# Patient Record
Sex: Male | Born: 1960 | Race: Black or African American | Hispanic: No | Marital: Married | Smoking: Former smoker
Health system: Southern US, Community
[De-identification: ages and names within clinical notes are randomized; demographics above are authoritative.]

## PROBLEM LIST (undated history)

## (undated) DIAGNOSIS — F1020 Alcohol dependence, uncomplicated: Secondary | ICD-10-CM

## (undated) DIAGNOSIS — I10 Essential (primary) hypertension: Secondary | ICD-10-CM

---

## 2004-05-16 ENCOUNTER — Emergency Department: Payer: Self-pay | Admitting: Emergency Medicine

## 2004-06-08 ENCOUNTER — Emergency Department: Payer: Self-pay | Admitting: Emergency Medicine

## 2006-10-22 ENCOUNTER — Emergency Department: Payer: Self-pay | Admitting: Emergency Medicine

## 2009-05-20 ENCOUNTER — Emergency Department (HOSPITAL_COMMUNITY): Admission: EM | Admit: 2009-05-20 | Discharge: 2009-05-20 | Payer: Self-pay | Admitting: Emergency Medicine

## 2010-06-26 LAB — COMPREHENSIVE METABOLIC PANEL
ALT: 46 U/L (ref 0–53)
Alkaline Phosphatase: 58 U/L (ref 39–117)
BUN: 8 mg/dL (ref 6–23)
CO2: 25 mEq/L (ref 19–32)
Chloride: 100 mEq/L (ref 96–112)
GFR calc non Af Amer: >60 mL/min (ref >60)
Glucose, Bld: 103 mg/dL — ABNORMAL HIGH (ref 70–99)
Potassium: 3.8 mEq/L (ref 3.5–5.1)
Sodium: 134 mEq/L — ABNORMAL LOW (ref 135–145)
Total Bilirubin: 0.4 mg/dL (ref 0.3–1.2)
Total Protein: 7.3 g/dL (ref 6.0–8.3)

## 2010-06-26 LAB — DIFFERENTIAL
Basophils Absolute: 0 10*3/uL (ref 0.0–0.1)
Basophils Relative: 1 % (ref 0–1)
Eosinophils Absolute: 0.1 10*3/uL (ref 0.0–0.7)
Monocytes Relative: 15 % — ABNORMAL HIGH (ref 3–12)
Neutro Abs: 2.6 10*3/uL (ref 1.7–7.7)
Neutrophils Relative %: 65 % (ref 43–77)

## 2010-06-26 LAB — CBC
HCT: 36.7 % — ABNORMAL LOW (ref 39.0–52.0)
Hemoglobin: 12.5 g/dL — ABNORMAL LOW (ref 13.0–17.0)
RBC: 3.76 MIL/uL — ABNORMAL LOW (ref 4.22–5.81)

## 2010-06-26 LAB — ETHANOL: Alcohol, Ethyl (B): <5 mg/dL (ref 0–10)

## 2010-06-26 LAB — RAPID URINE DRUG SCREEN, HOSP PERFORMED
Opiates: NOT DETECTED
Tetrahydrocannabinol: POSITIVE — AB

## 2015-12-11 ENCOUNTER — Encounter (HOSPITAL_COMMUNITY): Payer: Self-pay

## 2015-12-11 ENCOUNTER — Emergency Department (HOSPITAL_COMMUNITY)
Admission: EM | Admit: 2015-12-11 | Discharge: 2015-12-11 | Disposition: A | Discharge status: Home / Self Care | Payer: Self-pay | Attending: Emergency Medicine | Admitting: Emergency Medicine

## 2015-12-11 DIAGNOSIS — F17200 Nicotine dependence, unspecified, uncomplicated: Secondary | ICD-10-CM | POA: Insufficient documentation

## 2015-12-11 DIAGNOSIS — Z5181 Encounter for therapeutic drug level monitoring: Secondary | ICD-10-CM | POA: Insufficient documentation

## 2015-12-11 DIAGNOSIS — F10120 Alcohol abuse with intoxication, uncomplicated: Secondary | ICD-10-CM | POA: Insufficient documentation

## 2015-12-11 DIAGNOSIS — F10920 Alcohol use, unspecified with intoxication, uncomplicated: Secondary | ICD-10-CM

## 2015-12-11 LAB — I-STAT CHEM 8, ED
BUN: 3 mg/dL — AB (ref 6–20)
CALCIUM ION: 1.12 mmol/L — AB (ref 1.15–1.40)
CHLORIDE: 100 mmol/L — AB (ref 101–111)
Creatinine, Ser: 1.1 mg/dL (ref 0.61–1.24)
Glucose, Bld: 95 mg/dL (ref 65–99)
HEMATOCRIT: 38 % — AB (ref 39.0–52.0)
Hemoglobin: 12.9 g/dL — ABNORMAL LOW (ref 13.0–17.0)
Potassium: 3.5 mmol/L (ref 3.5–5.1)
SODIUM: 143 mmol/L (ref 135–145)
TCO2: 27 mmol/L (ref 0–100)

## 2015-12-11 LAB — RAPID URINE DRUG SCREEN, HOSP PERFORMED
AMPHETAMINES: NOT DETECTED
BARBITURATES: NOT DETECTED
BENZODIAZEPINES: NOT DETECTED
Cocaine: NOT DETECTED
Opiates: NOT DETECTED
Tetrahydrocannabinol: POSITIVE — AB

## 2015-12-11 LAB — ETHANOL: ALCOHOL ETHYL (B): 291 mg/dL — AB (ref <5)

## 2015-12-11 MED ORDER — NALOXONE HCL 0.4 MG/ML IJ SOLN: 0.4000 mg | Freq: Once | INTRAMUSCULAR | Status: DC

## 2015-12-11 MED ORDER — NALOXONE HCL 2 MG/2ML IJ SOSY
PREFILLED_SYRINGE | INTRAMUSCULAR | Status: AC
  Administered 2015-12-11: 2 mg
  Filled 2015-12-11: qty 2

## 2015-12-11 NOTE — ED Triage Notes (Addendum)
Per [facility], Pt was [place] in park unresponsive with apnea. Pt was given 4 mg of Narcan before he was at adequate respirations. When pt arrived to the ED, pt was alert and responsive to the staff. Pt admits to ETOH, but denies drug use. Vitals per EMS: 176/102, 120 HR, 14 RR, 97% on RA. Placed 18 Gauge in the L AC.

## 2015-12-11 NOTE — ED Notes (Signed)
Pt left at this time with all belongings.  

## 2015-12-11 NOTE — ED Provider Notes (Signed)
[facility]ENCY DEPT Provider Note   CSN: [id number] Arrival date & time: 12/11/15  1805     History   Chief Complaint Chief Complaint  Patient presents with  . Drug Overdose    HPI Scott Choi is a 55 y.o. male.  HPI Patient was found unresponsive by EMS at a park.  Reported the patient been drinking.  He responded to a total of 4 mg of Narcan and team suspected use of heroin.  Patient denies at this time.  EMS reports he is much more alert after Narcan.  Patient admits to smoking cannabis and using alcohol.  He denies use of heroin.     History reviewed. No pertinent past medical history.  There are no active problems to display for this patient.   History reviewed. No pertinent surgical history.     Home Medications    Prior to Admission medications   Medication Sig Start Date End Date Taking? Authorizing Provider  ibuprofen (ADVIL,MOTRIN) 200 MG tablet Take 200 mg by mouth every 6 (six) hours as needed for moderate pain.   Yes Historical Provider, MD    Family History No family history on file.  Social History Social History  Substance Use Topics  . Smoking status: Current Some Day Smoker  . Smokeless tobacco: Never Used  . Alcohol use Yes     Allergies   Aspirin   Review of Systems Review of Systems  All other systems reviewed and are negative.    Physical Exam Updated Vital Signs BP 121/84   Pulse 74   Temp 98.1 F (36.7 C) (Oral)   Resp 14   Ht 5\' 10"  (1.778 m)   Wt 150 lb (68 kg)   SpO2 100%   BMI 21.52 kg/m   Physical Exam  Constitutional: He is oriented to person, place, and time. He appears well-developed and well-nourished.  HENT:  Head: Normocephalic and atraumatic.  Eyes: EOM are normal.  Neck: Normal range of motion.  Cardiovascular: Normal rate, regular rhythm, normal heart sounds and intact distal pulses.   Pulmonary/Chest: Effort normal and breath sounds normal. No respiratory distress.  Abdominal: Soft. He  exhibits no distension. There is no tenderness.  Musculoskeletal: Normal range of motion.  Neurological: He is alert and oriented to person, place, and time.  Skin: Skin is warm and dry.  Psychiatric: He has a normal mood and affect. Judgment normal.  Nursing note and vitals reviewed.    ED Treatments / Results  Labs (all labs ordered are listed, but only abnormal results are displayed) Labs Reviewed  ETHANOL - Abnormal; Notable for the following:       Result Value   Alcohol, Ethyl (B) 291 (*)    All other components within normal limits  URINE RAPID DRUG SCREEN, HOSP PERFORMED - Abnormal; Notable for the following:    Tetrahydrocannabinol POSITIVE (*)    All other components within normal limits  I-STAT CHEM 8, ED - Abnormal; Notable for the following:    Chloride 100 (*)    BUN 3 (*)    Calcium, Ion 1.12 (*)    Hemoglobin 12.9 (*)    HCT 38.0 (*)    All other components within normal limits    EKG  EKG Interpretation None       Radiology No results found.  Procedures Procedures (including critical care time)  Medications Ordered in ED Medications  naloxone [facility](NARCAN) 2 MG/2ML injection (2 mg  Given 12/11/15 1902)     Initial Impression /  Assessment and Plan / ED Course  I have reviewed the triage vital signs and the nursing notes.  Pertinent labs & imaging results that were available during my care of the patient were reviewed by me and considered in my medical decision making (see chart for details).  Clinical Course    10:28 PM Patient feels much better this time.  Immature retina emergency department.  Urine drug screen negative except for marijuana.  Alcohol intoxication here.  Discharge home in good condition.  Final Clinical Impressions(s) / ED Diagnoses   Final diagnoses:  Alcohol intoxication, uncomplicated (HCC)    New Prescriptions New Prescriptions   No medications on file     Azalia Bilis, MD 12/11/15 2231

## 2015-12-11 NOTE — ED Notes (Signed)
Pt sleeping, but easy to rouse. Turned down lights so he could sleep.

## 2015-12-11 NOTE — ED Notes (Signed)
Ambulated patient in hall at request of MD, pt ambulated at a steady gait without assistance. Removed IV and instructed patient to dress. Notified MD.

## 2015-12-12 LAB — CBG MONITORING, ED: GLUCOSE-CAPILLARY: 106 mg/dL — AB (ref 65–99)

## 2019-01-12 ENCOUNTER — Inpatient Hospital Stay (HOSPITAL_COMMUNITY)
Admission: EM | Admit: 2019-01-12 | Discharge: 2019-01-15 | DRG: 812 | Disposition: A | Discharge status: Home / Self Care | Payer: Self-pay | Attending: Internal Medicine | Admitting: Internal Medicine

## 2019-01-12 ENCOUNTER — Emergency Department (HOSPITAL_COMMUNITY): Payer: Self-pay

## 2019-01-12 ENCOUNTER — Encounter (HOSPITAL_COMMUNITY): Payer: Self-pay

## 2019-01-12 ENCOUNTER — Other Ambulatory Visit: Payer: Self-pay

## 2019-01-12 DIAGNOSIS — Z20828 Contact with and (suspected) exposure to other viral communicable diseases: Secondary | ICD-10-CM | POA: Diagnosis present

## 2019-01-12 DIAGNOSIS — T39395A Adverse effect of other nonsteroidal anti-inflammatory drugs [NSAID], initial encounter: Secondary | ICD-10-CM | POA: Diagnosis present

## 2019-01-12 DIAGNOSIS — K209 Esophagitis, unspecified without bleeding: Secondary | ICD-10-CM | POA: Clinically undetermined

## 2019-01-12 DIAGNOSIS — Z791 Long term (current) use of non-steroidal anti-inflammatories (NSAID): Secondary | ICD-10-CM

## 2019-01-12 DIAGNOSIS — K642 Third degree hemorrhoids: Secondary | ICD-10-CM | POA: Diagnosis present

## 2019-01-12 DIAGNOSIS — K2970 Gastritis, unspecified, without bleeding: Secondary | ICD-10-CM | POA: Diagnosis present

## 2019-01-12 DIAGNOSIS — F10239 Alcohol dependence with withdrawal, unspecified: Secondary | ICD-10-CM

## 2019-01-12 DIAGNOSIS — F10939 Alcohol use, unspecified with withdrawal, unspecified: Secondary | ICD-10-CM

## 2019-01-12 DIAGNOSIS — K920 Hematemesis: Secondary | ICD-10-CM | POA: Diagnosis present

## 2019-01-12 DIAGNOSIS — G40909 Epilepsy, unspecified, not intractable, without status epilepticus: Secondary | ICD-10-CM | POA: Diagnosis present

## 2019-01-12 DIAGNOSIS — Z800 Family history of malignant neoplasm of digestive organs: Secondary | ICD-10-CM

## 2019-01-12 DIAGNOSIS — D500 Iron deficiency anemia secondary to blood loss (chronic): Principal | ICD-10-CM | POA: Diagnosis present

## 2019-01-12 DIAGNOSIS — Y900 Blood alcohol level of less than 20 mg/100 ml: Secondary | ICD-10-CM | POA: Diagnosis present

## 2019-01-12 DIAGNOSIS — Z8719 Personal history of other diseases of the digestive system: Secondary | ICD-10-CM

## 2019-01-12 DIAGNOSIS — Z87891 Personal history of nicotine dependence: Secondary | ICD-10-CM

## 2019-01-12 DIAGNOSIS — D508 Other iron deficiency anemias: Secondary | ICD-10-CM

## 2019-01-12 DIAGNOSIS — Z8249 Family history of ischemic heart disease and other diseases of the circulatory system: Secondary | ICD-10-CM

## 2019-01-12 DIAGNOSIS — D649 Anemia, unspecified: Secondary | ICD-10-CM | POA: Diagnosis present

## 2019-01-12 DIAGNOSIS — I10 Essential (primary) hypertension: Secondary | ICD-10-CM | POA: Diagnosis present

## 2019-01-12 DIAGNOSIS — Z833 Family history of diabetes mellitus: Secondary | ICD-10-CM

## 2019-01-12 DIAGNOSIS — Z886 Allergy status to analgesic agent status: Secondary | ICD-10-CM

## 2019-01-12 DIAGNOSIS — K449 Diaphragmatic hernia without obstruction or gangrene: Secondary | ICD-10-CM | POA: Diagnosis present

## 2019-01-12 DIAGNOSIS — K2100 Gastro-esophageal reflux disease with esophagitis, without bleeding: Secondary | ICD-10-CM | POA: Diagnosis present

## 2019-01-12 DIAGNOSIS — Z79899 Other long term (current) drug therapy: Secondary | ICD-10-CM

## 2019-01-12 DIAGNOSIS — F10230 Alcohol dependence with withdrawal, uncomplicated: Secondary | ICD-10-CM | POA: Diagnosis present

## 2019-01-12 DIAGNOSIS — R569 Unspecified convulsions: Secondary | ICD-10-CM

## 2019-01-12 HISTORY — DX: Alcohol dependence, uncomplicated: F10.20

## 2019-01-12 HISTORY — DX: Essential (primary) hypertension: I10

## 2019-01-12 LAB — CBC WITH DIFFERENTIAL/PLATELET
Abs Immature Granulocytes: 0.01 10*3/uL (ref 0.00–0.07)
Basophils Absolute: 0.1 10*3/uL (ref 0.0–0.1)
Basophils Relative: 2 %
Eosinophils Absolute: 0 10*3/uL (ref 0.0–0.5)
Eosinophils Relative: 0 %
HCT: 25 % — ABNORMAL LOW (ref 39.0–52.0)
Hemoglobin: 7 g/dL — ABNORMAL LOW (ref 13.0–17.0)
Immature Granulocytes: 0 %
Lymphocytes Relative: 9 %
Lymphs Abs: 0.3 10*3/uL — ABNORMAL LOW (ref 0.7–4.0)
MCH: 20.8 pg — ABNORMAL LOW (ref 26.0–34.0)
MCHC: 28 g/dL — ABNORMAL LOW (ref 30.0–36.0)
MCV: 74.4 fL — ABNORMAL LOW (ref 80.0–100.0)
Monocytes Absolute: 0.6 10*3/uL (ref 0.1–1.0)
Monocytes Relative: 18 %
Neutro Abs: 2.1 10*3/uL (ref 1.7–7.7)
Neutrophils Relative %: 71 %
Platelets: 243 10*3/uL (ref 150–400)
RBC: 3.36 MIL/uL — ABNORMAL LOW (ref 4.22–5.81)
RDW: 20.8 % — ABNORMAL HIGH (ref 11.5–15.5)
WBC: 3 10*3/uL — ABNORMAL LOW (ref 4.0–10.5)
nRBC: 0 % (ref 0.0–0.2)

## 2019-01-12 LAB — COMPREHENSIVE METABOLIC PANEL
ALT: 44 U/L (ref 0–44)
AST: 125 U/L — ABNORMAL HIGH (ref 15–41)
Albumin: 3.5 g/dL (ref 3.5–5.0)
Alkaline Phosphatase: 125 U/L (ref 38–126)
Anion gap: 19 — ABNORMAL HIGH (ref 5–15)
BUN: <5 mg/dL — ABNORMAL LOW (ref 6–20)
CO2: 17 mmol/L — ABNORMAL LOW (ref 22–32)
Calcium: 9.1 mg/dL (ref 8.9–10.3)
Chloride: 98 mmol/L (ref 98–111)
Creatinine, Ser: 0.9 mg/dL (ref 0.61–1.24)
GFR calc Af Amer: >60 mL/min (ref >60)
GFR calc non Af Amer: >60 mL/min (ref >60)
Glucose, Bld: 98 mg/dL (ref 70–99)
Potassium: 3.5 mmol/L (ref 3.5–5.1)
Sodium: 134 mmol/L — ABNORMAL LOW (ref 135–145)
Total Bilirubin: 0.7 mg/dL (ref 0.3–1.2)
Total Protein: 8.2 g/dL — ABNORMAL HIGH (ref 6.5–8.1)

## 2019-01-12 LAB — POCT I-STAT EG7
Acid-base deficit: 3 mmol/L — ABNORMAL HIGH (ref 0.0–2.0)
Bicarbonate: 22 mmol/L (ref 20.0–28.0)
Calcium, Ion: 1.14 mmol/L — ABNORMAL LOW (ref 1.15–1.40)
HCT: 25 % — ABNORMAL LOW (ref 39.0–52.0)
Hemoglobin: 8.5 g/dL — ABNORMAL LOW (ref 13.0–17.0)
O2 Saturation: 74 %
Potassium: 3.5 mmol/L (ref 3.5–5.1)
Sodium: 135 mmol/L (ref 135–145)
TCO2: 23 mmol/L (ref 22–32)
pCO2, Ven: 36.7 mmHg — ABNORMAL LOW (ref 44.0–60.0)
pH, Ven: 7.386 (ref 7.250–7.430)
pO2, Ven: 39 mmHg (ref 32.0–45.0)

## 2019-01-12 LAB — FERRITIN: Ferritin: 3 ng/mL — ABNORMAL LOW (ref 24–336)

## 2019-01-12 LAB — RAPID URINE DRUG SCREEN, HOSP PERFORMED
Amphetamines: NOT DETECTED
Barbiturates: NOT DETECTED
Benzodiazepines: NOT DETECTED
Cocaine: NOT DETECTED
Opiates: NOT DETECTED
Tetrahydrocannabinol: POSITIVE — AB

## 2019-01-12 LAB — POC OCCULT BLOOD, ED: Fecal Occult Bld: NEGATIVE

## 2019-01-12 LAB — FOLATE: Folate: 15.2 ng/mL (ref >5.9)

## 2019-01-12 LAB — MAGNESIUM: Magnesium: 1.5 mg/dL — ABNORMAL LOW (ref 1.7–2.4)

## 2019-01-12 LAB — IRON AND TIBC
Iron: 25 ug/dL — ABNORMAL LOW (ref 45–182)
Saturation Ratios: 5 % — ABNORMAL LOW (ref 17.9–39.5)
TIBC: 515 ug/dL — ABNORMAL HIGH (ref 250–450)
UIBC: 490 ug/dL

## 2019-01-12 LAB — PREPARE RBC (CROSSMATCH)

## 2019-01-12 LAB — VITAMIN B12: Vitamin B-12: 158 pg/mL — ABNORMAL LOW (ref 180–914)

## 2019-01-12 LAB — RETICULOCYTES
Immature Retic Fract: 24.9 % — ABNORMAL HIGH (ref 2.3–15.9)
RBC.: 3.17 MIL/uL — ABNORMAL LOW (ref 4.22–5.81)
Retic Count, Absolute: 52.3 10*3/uL (ref 19.0–186.0)
Retic Ct Pct: 1.7 % (ref 0.4–3.1)

## 2019-01-12 LAB — SARS CORONAVIRUS 2 BY RT PCR (HOSPITAL ORDER, PERFORMED IN ~~LOC~~ HOSPITAL LAB): SARS Coronavirus 2: NEGATIVE

## 2019-01-12 LAB — HEMOGLOBIN: Hemoglobin: 6.6 g/dL — CL (ref 13.0–17.0)

## 2019-01-12 LAB — ETHANOL: Alcohol, Ethyl (B): <10 mg/dL (ref <10)

## 2019-01-12 LAB — PHOSPHORUS: Phosphorus: 3.6 mg/dL (ref 2.5–4.6)

## 2019-01-12 LAB — HIV ANTIBODY (ROUTINE TESTING W REFLEX): HIV Screen 4th Generation wRfx: NONREACTIVE

## 2019-01-12 LAB — ABO/RH: ABO/RH(D): O POS

## 2019-01-12 MED ORDER — LORAZEPAM 2 MG/ML IJ SOLN
1.0000 mg | Freq: Once | INTRAMUSCULAR | Status: AC
  Administered 2019-01-12: 1 mg via INTRAVENOUS
  Filled 2019-01-12: qty 1

## 2019-01-12 MED ORDER — PANTOPRAZOLE SODIUM 40 MG IV SOLR
40.0000 mg | Freq: Once | INTRAVENOUS | Status: AC
  Administered 2019-01-12: 11:00:00 40 mg via INTRAVENOUS
  Filled 2019-01-12: qty 40

## 2019-01-12 MED ORDER — LORAZEPAM 1 MG PO TABS
1.0000 mg | ORAL_TABLET | ORAL | Status: DC | PRN
  Administered 2019-01-13: 1 mg via ORAL
  Filled 2019-01-12: qty 1

## 2019-01-12 MED ORDER — ADULT MULTIVITAMIN W/MINERALS CH
1.0000 | ORAL_TABLET | Freq: Every day | ORAL | Status: DC
  Administered 2019-01-13 – 2019-01-15 (×2): 1 via ORAL
  Filled 2019-01-12 (×2): qty 1

## 2019-01-12 MED ORDER — SODIUM CHLORIDE 0.9 % IV BOLUS
1000.0000 mL | Freq: Once | INTRAVENOUS | Status: AC
  Administered 2019-01-12: 14:00:00 1000 mL via INTRAVENOUS

## 2019-01-12 MED ORDER — SODIUM CHLORIDE 0.9 % IV SOLN: 250.0000 mL | INTRAVENOUS | Status: DC | PRN

## 2019-01-12 MED ORDER — FOLIC ACID 1 MG PO TABS
1.0000 mg | ORAL_TABLET | Freq: Every day | ORAL | Status: DC
  Administered 2019-01-13 – 2019-01-15 (×2): 1 mg via ORAL
  Filled 2019-01-12 (×2): qty 1

## 2019-01-12 MED ORDER — ONDANSETRON HCL 4 MG/2ML IJ SOLN
4.0000 mg | Freq: Once | INTRAMUSCULAR | Status: AC
  Administered 2019-01-12: 4 mg via INTRAVENOUS
  Filled 2019-01-12: qty 2

## 2019-01-12 MED ORDER — SODIUM CHLORIDE 0.9 % IV BOLUS
1000.0000 mL | Freq: Once | INTRAVENOUS | Status: AC
  Administered 2019-01-12: 11:00:00 1000 mL via INTRAVENOUS

## 2019-01-12 MED ORDER — ACETAMINOPHEN 325 MG PO TABS
650.0000 mg | ORAL_TABLET | Freq: Four times a day (QID) | ORAL | Status: DC | PRN
  Administered 2019-01-14: 650 mg via ORAL
  Filled 2019-01-12: qty 2

## 2019-01-12 MED ORDER — ACETAMINOPHEN 650 MG RE SUPP: 650.0000 mg | Freq: Four times a day (QID) | RECTAL | Status: DC | PRN

## 2019-01-12 MED ORDER — ONDANSETRON HCL 4 MG/2ML IJ SOLN
4.0000 mg | Freq: Four times a day (QID) | INTRAMUSCULAR | Status: DC | PRN
  Administered 2019-01-14: 4 mg via INTRAVENOUS
  Filled 2019-01-12: qty 2

## 2019-01-12 MED ORDER — SODIUM CHLORIDE 0.9% FLUSH
3.0000 mL | Freq: Two times a day (BID) | INTRAVENOUS | Status: DC
  Administered 2019-01-13 (×2): 3 mL via INTRAVENOUS

## 2019-01-12 MED ORDER — VITAMIN B-1 100 MG PO TABS
100.0000 mg | ORAL_TABLET | Freq: Every day | ORAL | Status: DC
  Administered 2019-01-13 – 2019-01-15 (×2): 100 mg via ORAL
  Filled 2019-01-12 (×3): qty 1

## 2019-01-12 MED ORDER — LORAZEPAM 2 MG/ML IJ SOLN
1.0000 mg | INTRAMUSCULAR | Status: DC | PRN
  Administered 2019-01-12: 18:00:00 3 mg via INTRAVENOUS
  Filled 2019-01-12: qty 2

## 2019-01-12 MED ORDER — ONDANSETRON HCL 4 MG PO TABS: 4.0000 mg | ORAL_TABLET | Freq: Four times a day (QID) | ORAL | Status: DC | PRN

## 2019-01-12 MED ORDER — SODIUM CHLORIDE 0.9% FLUSH
3.0000 mL | INTRAVENOUS | Status: DC | PRN
  Administered 2019-01-12: 3 mL via INTRAVENOUS

## 2019-01-12 MED ORDER — SODIUM CHLORIDE 0.9% IV SOLUTION: Freq: Once | INTRAVENOUS | Status: DC

## 2019-01-12 MED ORDER — THIAMINE HCL 100 MG/ML IJ SOLN
Freq: Once | INTRAVENOUS | Status: AC
  Administered 2019-01-12: 20:00:00 via INTRAVENOUS
  Filled 2019-01-12: qty 1000

## 2019-01-12 MED ORDER — PANTOPRAZOLE SODIUM 40 MG IV SOLR
40.0000 mg | Freq: Two times a day (BID) | INTRAVENOUS | Status: DC
  Administered 2019-01-12 – 2019-01-13 (×2): 40 mg via INTRAVENOUS
  Filled 2019-01-12 (×2): qty 40

## 2019-01-12 MED ORDER — THIAMINE HCL 100 MG/ML IJ SOLN: 100.0000 mg | Freq: Every day | INTRAMUSCULAR | Status: DC

## 2019-01-12 NOTE — ED Notes (Signed)
Patient sister Arvid Right [phone]  Would like a call back, states patient won't be able to tell her what is wrong with him.

## 2019-01-12 NOTE — ED Notes (Signed)
ED TO INPATIENT HANDOFF REPORT  ED Nurse Name and Phone #:  [phone]  S Name/Age/Gender Scott Choi 58 y.o. male Room/Bed: 014C/014C  Code Status   Code Status: Full Code  Home/SNF/Other Home Patient oriented to: self and situation Is this baseline? No   Triage Complete: Triage complete  Chief Complaint sz  Triage Note Pt brought via [facility] from a street corner. Pt had a witnessed seizure by a by stander. Full body seizure that lasted 1-2 mins. Pt denies hx of seizures. EMS stated pt was post tictal and only responded to verbal stimuli on scene. Pt states he drinks a couple of beers a day and his last drink was last night.  Pt is A&Ox3. Pt is sinus tach on monitor   Allergies Allergies  Allergen Reactions  . Aspirin     shaking    Level of Care/Admitting Diagnosis ED Disposition    ED Disposition Condition Comment   Admit  [facility] [[id number][facility]  Level of Care: Telemetry Medical [104]  Covid Evaluation: Asymptomatic Screening Protocol (No Symptoms)  Diagnosis: Anemia [161096][[id number]]  Admitting Physician: Sharyon MedicusHIJAZI, ALI Bai.Lain[[id number]]  Attending Physician: HIJAZI, ALI Bai.Lain[[id number]]  Estimated length of stay: past midnight tomorrow  Certification:: I certify this patient will need inpatient services for at least 2 midnights  PT Class (Do Not Modify): Inpatient [101]  PT Acc Code (Do Not Modify): Private [1]       B Medical/Surgery History Past Medical History:  Diagnosis Date  . Alcoholic (HCC)   . Hypertension    History reviewed. No pertinent surgical history.   A IV Location/Drains/Wounds Patient Lines/Drains/Airways Status   Active Line/Drains/Airways    Name:   Placement date:   Placement time:   Site:   Days:   Peripheral IV 01/12/19 Left Forearm   01/12/19    -    Forearm   less than 1   Peripheral IV 01/12/19 Right Antecubital   01/12/19    1023    Antecubital   less than 1          Intake/Output Last 24 hours  Intake/Output Summary  (Last 24 hours) at 01/12/2019 2335 Last data filed at 01/12/2019 2234 Gross per 24 hour  Intake 2638 ml  Output 1600 ml  Net 1038 ml    Labs/Imaging Results for orders placed or performed during the hospital encounter of 01/12/19 (from the past 48 hour(s))  CBC with Differential/Platelet     Status: Abnormal   Collection Time: 01/12/19 10:22 AM  Result Value Ref Range   WBC 3.0 (L) 4.0 - 10.5 K/uL   RBC 3.36 (L) 4.22 - 5.81 MIL/uL   Hemoglobin 7.0 (L) 13.0 - 17.0 g/dL    Comment: Reticulocyte Hemoglobin testing may be clinically indicated, consider ordering this additional test [id number]    HCT 25.0 (L) 39.0 - 52.0 %   MCV 74.4 (L) 80.0 - 100.0 fL   MCH 20.8 (L) 26.0 - 34.0 pg   MCHC 28.0 (L) 30.0 - 36.0 g/dL   RDW 19.120.8 (H) 47.811.5 - 29.515.5 %   Platelets 243 150 - 400 K/uL   nRBC 0.0 0.0 - 0.2 %   Neutrophils Relative % 71 %   Neutro Abs 2.1 1.7 - 7.7 K/uL   Lymphocytes Relative 9 %   Lymphs Abs 0.3 (L) 0.7 - 4.0 K/uL   Monocytes Relative 18 %   Monocytes Absolute 0.6 0.1 - 1.0 K/uL   Eosinophils Relative 0 %   Eosinophils  Absolute 0.0 0.0 - 0.5 K/uL   Basophils Relative 2 %   Basophils Absolute 0.1 0.0 - 0.1 K/uL   Immature Granulocytes 0 %   Abs Immature Granulocytes 0.01 0.00 - 0.07 K/uL    Comment: Performed at [facility], [address], [place], [place] [address]  Comprehensive metabolic panel     Status: Abnormal   Collection Time: 01/12/19 10:22 AM  Result Value Ref Range   Sodium 134 (L) 135 - 145 mmol/L   Potassium 3.5 3.5 - 5.1 mmol/L   Chloride 98 98 - 111 mmol/L   CO2 17 (L) 22 - 32 mmol/L   Glucose, Bld 98 70 - 99 mg/dL   BUN <5 (L) 6 - 20 mg/dL   Creatinine, Ser 5.36 0.61 - 1.24 mg/dL   Calcium 9.1 8.9 - 64.4 mg/dL   Total Protein 8.2 (H) 6.5 - 8.1 g/dL   Albumin 3.5 3.5 - 5.0 g/dL   AST 034 (H) 15 - 41 U/L   ALT 44 0 - 44 U/L   Alkaline Phosphatase 125 38 - 126 U/L   Total Bilirubin 0.7 0.3 - 1.2 mg/dL   GFR calc non Af Amer >60 >60 mL/min    GFR calc Af Amer >60 >60 mL/min   Anion gap 19 (H) 5 - 15    Comment: Performed at [facility] Lab, [address], [place], [place] [address]  Ethanol     Status: None   Collection Time: 01/12/19 10:22 AM  Result Value Ref Range   Alcohol, Ethyl (B) <10 <10 mg/dL    Comment: (NOTE) Lowest detectable limit for serum alcohol is 10 mg/dL. For medical purposes only. Performed at [facility], [address] [place], [place] [address]   POC occult blood, ED     Status: None   Collection Time: 01/12/19 12:35 PM  Result Value Ref Range   Fecal Occult Bld NEGATIVE NEGATIVE  Rapid urine drug screen (hospital performed)     Status: Abnormal   Collection Time: 01/12/19  2:15 PM  Result Value Ref Range   Opiates NONE DETECTED NONE DETECTED   Cocaine NONE DETECTED NONE DETECTED   Benzodiazepines NONE DETECTED NONE DETECTED   Amphetamines NONE DETECTED NONE DETECTED   Tetrahydrocannabinol POSITIVE (A) NONE DETECTED   Barbiturates NONE DETECTED NONE DETECTED    Comment: (NOTE) DRUG SCREEN FOR MEDICAL PURPOSES ONLY.  IF CONFIRMATION IS NEEDED FOR ANY PURPOSE, NOTIFY LAB WITHIN 5 DAYS. LOWEST DETECTABLE LIMITS FOR URINE DRUG SCREEN Drug Class                     Cutoff (ng/mL) Amphetamine and metabolites    1000 Barbiturate and metabolites    200 Benzodiazepine                 200 Tricyclics and metabolites     300 Opiates and metabolites        300 Cocaine and metabolites        300 THC                            50 Performed at [facility] Lab, [address] [place], [place] [address]   SARS Coronavirus 2 by RT PCR (hospital order, performed in [facility] hospital lab) Nasopharyngeal Nasopharyngeal Swab     Status: None   Collection Time: 01/12/19  2:15 PM   Specimen: Nasopharyngeal Swab  Result Value Ref Range   SARS Coronavirus 2 NEGATIVE NEGATIVE    Comment: (NOTE) If result is NEGATIVE SARS-CoV-2 target nucleic acids are NOT DETECTED. The SARS-CoV-2 RNA is  generally detectable in upper and lower  respiratory specimens during the acute phase of infection. The lowest  concentration of SARS-CoV-2 viral copies this assay can detect is 250  copies / mL. A negative result does not preclude SARS-CoV-2 infection  and should not be used as the sole basis for treatment or other  patient management decisions.  A negative result may occur with  improper specimen collection / handling, submission of specimen other  than nasopharyngeal swab, presence of viral mutation(s) within the  areas targeted by this assay, and inadequate number of viral copies  (<250 copies / mL). A negative result must be combined with clinical  observations, patient history, and epidemiological information. If result is POSITIVE SARS-CoV-2 target nucleic acids are DETECTED. The SARS-CoV-2 RNA is generally detectable in upper and lower  respiratory specimens dur ing the acute phase of infection.  Positive  results are indicative of active infection with SARS-CoV-2.  Clinical  correlation with patient history and other diagnostic information is  necessary to determine patient infection status.  Positive results do  not rule out bacterial infection or co-infection with other viruses. If result is PRESUMPTIVE POSTIVE SARS-CoV-2 nucleic acids MAY BE PRESENT.   A presumptive positive result was obtained on the submitted specimen  and confirmed on repeat testing.  While 2019 novel coronavirus  (SARS-CoV-2) nucleic acids may be present in the submitted sample  additional confirmatory testing may be necessary for epidemiological  and / or clinical management purposes  to differentiate between  SARS-CoV-2 and other Sarbecovirus currently known to infect humans.  If clinically indicated additional testing with an alternate test  methodology [phone]) is advised. The SARS-CoV-2 RNA is generally  detectable in upper and lower respiratory sp ecimens during the acute  phase of  infection. The expected result is Negative. Fact Sheet for Patients:  BoilerBrush.com.cy Fact Sheet for Healthcare Providers: https://pope.com/ This test is not yet approved or cleared by the [place] FDA and has been authorized for detection and/or diagnosis of SARS-CoV-2 by FDA under an Emergency Use Authorization (EUA).  This EUA will remain in effect (meaning this test can be used) for the duration of the COVID-19 declaration under Section 564(b)(1) of the Act, 21 U.S.C. section 360bbb-3(b)(1), unless the authorization is terminated or revoked sooner. Performed at [facility] Lab, [address], [place], [place] [address]   Vitamin B12     Status: Abnormal   Collection Time: 01/12/19  2:59 PM  Result Value Ref Range   Vitamin B-12 158 (L) 180 - 914 pg/mL    Comment: (NOTE) This assay is not validated for testing neonatal or myeloproliferative syndrome specimens for Vitamin B12 levels. Performed at [facility], [address], [place], [place] [address]   Iron and TIBC     Status: Abnormal   Collection Time: 01/12/19  2:59 PM  Result Value Ref Range   Iron 25 (L) 45 - 182 ug/dL   TIBC 073 (H) 710 - 626 ug/dL   Saturation Ratios 5 (L) 17.9 - 39.5 %   UIBC 490 ug/dL    Comment: Performed at [facility], [address] [place], [place] [address]  Ferritin     Status: Abnormal   Collection Time: 01/12/19  2:59 PM  Result Value Ref Range  Ferritin 3 (L) 24 - 336 ng/mL    Comment: Performed at [facility], [place] [address] [place], [place] [address]  Reticulocytes     Status: Abnormal   Collection Time: 01/12/19  2:59 PM  Result Value Ref Range   Retic Ct Pct 1.7 0.4 - 3.1 %   RBC. 3.17 (L) 4.22 - 5.81 MIL/uL   Retic Count, Absolute 52.3 19.0 - 186.0 K/uL   Immature Retic Fract 24.9 (H) 2.3 - 15.9 %    Comment: Performed at [place] [address] [place], [place] [address]  Folate      Status: None   Collection Time: 01/12/19  2:59 PM  Result Value Ref Range   Folate 15.2 >5.9 ng/mL    Comment: Performed at [facility], [place] [address], [place], [place] [address]  Magnesium     Status: Abnormal   Collection Time: 01/12/19  3:00 PM  Result Value Ref Range   Magnesium 1.5 (L) 1.7 - 2.4 mg/dL    Comment: Performed at [place] [address], [place], [place] [address]  Phosphorus     Status: None   Collection Time: 01/12/19  3:00 PM  Result Value Ref Range   Phosphorus 3.6 2.5 - 4.6 mg/dL    Comment: Performed at [facility], [place] [address], [place], [place] [address]  POCT I-Stat EG7     Status: Abnormal   Collection Time: 01/12/19  3:20 PM  Result Value Ref Range   pH, Ven 7.386 7.250 - 7.430   pCO2, Ven 36.7 (L) 44.0 - 60.0 mmHg   pO2, Ven 39.0 32.0 - 45.0 mmHg   Bicarbonate 22.0 20.0 - 28.0 mmol/L   TCO2 23 22 - 32 mmol/L   O2 Saturation 74.0 %   Acid-base deficit 3.0 (H) 0.0 - 2.0 mmol/L   Sodium 135 135 - 145 mmol/L   Potassium 3.5 3.5 - 5.1 mmol/L   Calcium, Ion 1.14 (L) 1.15 - 1.40 mmol/L   HCT 25.0 (L) 39.0 - 52.0 %   Hemoglobin 8.5 (L) 13.0 - 17.0 g/dL   Patient temperature HIDE    Sample type VENOUS    Comment NOTIFIED PHYSICIAN   HIV Antibody (routine testing w rflx)     Status: None   Collection Time: 01/12/19  5:47 PM  Result Value Ref Range   HIV Screen 4th Generation wRfx NON REACTIVE NON REACTIVE    Comment: Performed at [facility], [address] [place], [place] [address]  Hemoglobin     Status: Abnormal   Collection Time: 01/12/19  5:47 PM  Result Value Ref Range   Hemoglobin 6.6 (LL) 13.0 - 17.0 g/dL    Comment: REPEATED TO VERIFY THIS CRITICAL RESULT HAS VERIFIED AND BEEN CALLED TO M RUGGIERO,RN BY DENNIS BRADLEY ON 10 08 2020 AT 1831, AND HAS BEEN READ BACK.  Performed at [facility], [place] [address] [place], [place] [address]   Type and screen [place]     Status: None  (Preliminary result)   Collection Time: 01/12/19  6:00 PM  Result Value Ref Range   ABO/RH(D) O POS    Antibody Screen NEG    Sample Expiration 01/15/2019,2359    Unit Number [id number]    Blood Component Type RBC LR PHER2    Unit division 00    Status of Unit ISSUED    Transfusion Status OK TO TRANSFUSE  Crossmatch Result      Compatible Performed at [facility], [address] [place], [place] [address]   Prepare RBC     Status: None   Collection Time: 01/12/19  6:00 PM  Result Value Ref Range   Order Confirmation      ORDER PROCESSED BY BLOOD BANK Performed at [facility], [address] [place], [place] [address]   ABO/Rh     Status: None   Collection Time: 01/12/19  6:00 PM  Result Value Ref Range   ABO/RH(D)      O POS Performed at [facility], [address], [place], [place] [address]    Ct Head Wo Contrast  Result Date: 01/12/2019 CLINICAL DATA:  58 year old male with seizure witnessed by bystander. Altered mental status. EXAM: CT HEAD WITHOUT CONTRAST TECHNIQUE: Contiguous axial images were obtained from the base of the skull through the vertex without intravenous contrast. COMPARISON:  Head CT 10/22/2006 [facility] FINDINGS: Brain: Mild cerebral volume loss since 2008 appears generalized. No midline shift, ventriculomegaly, mass effect, evidence of mass lesion, intracranial hemorrhage or evidence of cortically based acute infarction. Gray-white matter differentiation is within normal limits throughout the brain. Vascular: Minimal Calcified atherosclerosis at the skull base. No suspicious intracranial vascular hyperdensity. Skull: No acute osseous abnormality identified. Sinuses/Orbits: Mild right maxillary sinus mucosal thickening, otherwise clear. Other: Chronic left posterior scalp lipoma. No acute orbit or scalp soft tissue findings. IMPRESSION: No acute intracranial abnormality. Negative for age non contrast CT  appearance of the brain. Electronically Signed   By: Odessa Fleming M.D.   On: 01/12/2019 12:06    Pending Labs Unresulted Labs (From admission, onward)    Start     Ordered   01/13/19 0500  Occult blood card to lab, stool  Daily,   R (with STAT occurrences)     01/12/19 1746   01/12/19 1747  HIV4GL Save Tube  (HIV Antibody (Routine testing w reflex) panel)  Once,   STAT     01/12/19 1746   01/12/19 1747  Hemoglobin  Now then every 6 hours,   R (with STAT occurrences)     01/12/19 1746          Vitals/Pain Today's Vitals   01/12/19 2130 01/12/19 2200 01/12/19 2230 01/12/19 2333  BP: (!) 142/98 (!) 150/89 (!) 159/98   Pulse: 93 87 86   Resp: 17 (!) 21 16   Temp:      TempSrc:      SpO2: 100% 100% 100%   Weight:      Height:      PainSc:    0-No pain    Isolation Precautions No active isolations  Medications Medications  LORazepam (ATIVAN) tablet 1-4 mg ( Oral See Alternative 01/12/19 1821)    Or  LORazepam (ATIVAN) injection 1-4 mg (3 mg Intravenous Given 01/12/19 1821)  thiamine (VITAMIN B-1) tablet 100 mg ( Oral See Alternative 01/12/19 1750)    Or  thiamine (B-1) injection 100 mg (0 mg Intravenous Hold 01/12/19 1750)  folic acid (FOLVITE) tablet 1 mg (0 mg Oral Hold 01/12/19 1751)  multivitamin with minerals tablet 1 tablet (0 tablets Oral Hold 01/12/19 1751)  sodium chloride flush (NS) 0.9 % injection 3 mL (has no administration in time range)  sodium chloride flush (NS) 0.9 % injection 3 mL (3 mLs Intravenous Given 01/12/19 2232)  0.9 %  sodium chloride infusion (has no administration in time range)  ondansetron (  ZOFRAN) tablet 4 mg (has no administration in time range)    Or  ondansetron (ZOFRAN) injection 4 mg (has no administration in time range)  pantoprazole (PROTONIX) injection 40 mg (40 mg Intravenous Given 01/12/19 2229)  acetaminophen (TYLENOL) tablet 650 mg (has no administration in time range)    Or  acetaminophen (TYLENOL) suppository 650 mg (has no  administration in time range)  0.9 %  sodium chloride infusion (Manually program via Guardrails IV Fluids) (has no administration in time range)  sodium chloride 0.9 % bolus 1,000 mL (0 mLs Intravenous Stopped 01/12/19 1327)  pantoprazole (PROTONIX) injection 40 mg (40 mg Intravenous Given 01/12/19 1120)  LORazepam (ATIVAN) injection 1 mg (1 mg Intravenous Given 01/12/19 1358)  ondansetron (ZOFRAN) injection 4 mg (4 mg Intravenous Given 01/12/19 1357)  sodium chloride 0.9 % bolus 1,000 mL (0 mLs Intravenous Stopped 01/12/19 1748)  sodium chloride 0.9 % 1,000 mL with thiamine 100 mg, folic acid 1 mg, multivitamins adult 10 mL infusion ( Intravenous New Bag/Given 01/12/19 1957)    Mobility walks with person assist Low fall risk   Focused Assessments Neuro Assessment Handoff:  Swallow screen pass? Yes  Cardiac Rhythm: Normal sinus rhythm   Last date known well: 01/12/19   Neuro Assessment: Exceptions to WDL Neuro Checks:      Last Documented NIHSS Modified Score:   Has TPA been given? Yes Temp: 98.6 F (37 C) (10/08 2127) Temp Source: Oral (10/08 2127) BP: 159/98 (10/08 2230) Pulse Rate: 86 (10/08 2230) If patient is a Neuro Trauma and patient is going to OR before floor call report to 4N Charge nurse: [phone] or [phone]     R Recommendations: See Admitting Provider Note  Report given to:   Additional Notes: -

## 2019-01-12 NOTE — H&P (Signed)
[facility]                                                                                    Patient Demographics  Scott Choi, is a 58 y.o. male  CSN: [id number]  MRN: [id number]  DOB - 01-07-1961  Admit Date - 01/12/2019  Outpatient Primary MD for the patient is Patient, No Pcp Per   With History of -  Past Medical History:  Diagnosis Date  . Alcoholic ([place])   . Hypertension       History reviewed. No pertinent surgical history.  in for   Chief Complaint  Patient presents with  . Seizures     HPI  Scott Choi  is a 57 y.o. male, with past medical history significant for alcoholism, brought to our facility by EMS for witnessed seizure by a bystander.  Patient reports his last drink was yesterday.  EMS reports postictal state In the emergency room mental status continued to deteriorate and he was found to be anemic.  Patient has history of taking nonsteroidals.  His stool guaiac was negative but his hemoglobin was 7.  Patient reports reddish stools at time Patient will be admitted for blood transfusion and monitoring with CIWA protocol.  Monitor his hemoglobin and check a stool guaiac again. When I saw the patient in the ER he said that he feels woozy.   Review of Systems    Denies chest pains, shortness of breath, nausea vomiting or diarrhea.  Patient reports seeing reddish stools at times on and off.  Patient denies any history of seizures or history of abdominal ulcers.  Denies any history of dysphagia, polyuria, polydipsia.   Social History Social History   Tobacco Use  . Smoking status: Former Research scientist (life sciences)  . Smokeless tobacco: Never Used  Substance Use Topics  . Alcohol use: Yes    Alcohol/week: 42.0 standard drinks    Types: 28 Cans of beer, 14 Shots of liquor per week     Family History Significant for diabetes and cardiomyopathy in his mother  Prior to Admission medications   Medication Sig Start Date End Date Taking? Authorizing  Provider  ibuprofen (ADVIL,MOTRIN) 200 MG tablet Take 200 mg by mouth every 6 (six) hours as needed for moderate pain.    [provider]    Allergies  Allergen Reactions  . Aspirin     shaking    Physical Exam  Vitals  Blood pressure (!) 162/98, pulse (!) 146, temperature 98.3 F (36.8 C), temperature source Oral, resp. rate (!) 22, height 5\' 9"  (1.753 m), weight 68 kg, SpO2 100 %. General appearance in no acute distress, alert awake oriented x3 HEENT no jaundice or pallor, no facial deviation Neck supple, no neck vein distention Chest clear and resonant Heart normal S1-S2, no murmurs gallops rubs Abdomen soft, nontender, bowel sounds are present Extremities no clubbing cyanosis or edema Neuro grossly nonfocal, patient moving all extremities Skin no rashes or ulcers    Data Review  CBC Recent Labs  Lab 01/12/19 1022  WBC 3.0*  HGB 7.0*  HCT 25.0*  PLT 243  MCV 74.4*  MCH 20.8*  MCHC 28.0*  RDW  20.8*  LYMPHSABS 0.3*  MONOABS 0.6  EOSABS 0.0  BASOSABS 0.1   ------------------------------------------------------------------------------------------------------------------  Chemistries  Recent Labs  Lab 01/12/19 1022  NA 134*  K 3.5  CL 98  CO2 17*  GLUCOSE 98  BUN <5*  CREATININE 0.90  CALCIUM 9.1  AST 125*  ALT 44  ALKPHOS 125  BILITOT 0.7   ------------------------------------------------------------------------------------------------------------------ estimated creatinine clearance is 88.1 mL/min (by C-G formula based on SCr of 0.9 mg/dL). ------------------------------------------------------------------------------------------------------------------ No results for input(s): TSH, T4TOTAL, T3FREE, THYROIDAB in the last 72 hours.  Invalid input(s): FREET3   Coagulation profile No results for input(s): INR, PROTIME in the last 168  hours. ------------------------------------------------------------------------------------------------------------------- No results for input(s): DDIMER in the last 72 hours. -------------------------------------------------------------------------------------------------------------------  Cardiac Enzymes No results for input(s): CKMB, TROPONINI, MYOGLOBIN in the last 168 hours.  Invalid input(s): CK ------------------------------------------------------------------------------------------------------------------ Invalid input(s): POCBNP   ---------------------------------------------------------------------------------------------------------------  Urinalysis No results found for: COLORURINE, APPEARANCEUR, LABSPEC, PHURINE, GLUCOSEU, HGBUR, BILIRUBINUR, KETONESUR, PROTEINUR, UROBILINOGEN, NITRITE, LEUKOCYTESUR  ----------------------------------------------------------------------------------------------------------------   Imaging results:   Ct Head Wo Contrast  Result Date: 01/12/2019 CLINICAL DATA:  58 year old male with seizure witnessed by bystander. Altered mental status. EXAM: CT HEAD WITHOUT CONTRAST TECHNIQUE: Contiguous axial images were obtained from the base of the skull through the vertex without intravenous contrast. COMPARISON:  Head CT 10/22/2006 [facility] FINDINGS: Brain: Mild cerebral volume loss since 2008 appears generalized. No midline shift, ventriculomegaly, mass effect, evidence of mass lesion, intracranial hemorrhage or evidence of cortically based acute infarction. Gray-white matter differentiation is within normal limits throughout the brain. Vascular: Minimal Calcified atherosclerosis at the skull base. No suspicious intracranial vascular hyperdensity. Skull: No acute osseous abnormality identified. Sinuses/Orbits: Mild right maxillary sinus mucosal thickening, otherwise clear. Other: Chronic left posterior scalp lipoma. No acute orbit  or scalp soft tissue findings. IMPRESSION: No acute intracranial abnormality. Negative for age non contrast CT appearance of the brain. Electronically Signed   By: Odessa Fleming M.D.   On: 01/12/2019 12:06    My personal review of EKG: Sinus tach at 106 bpm  Assessment & Plan  Alcohol withdrawal Ulice Dash Admit to telemetry for observation Ativan as needed/CIWA protocol   Anemia Stool guaiac is negative Blood transfusion Monitor hemoglobin in a.m., hemoglobin to be checked every 6 hours . ?  GI consult in a.m. if stable.    DVT Prophylaxis SCDs  AM Labs Ordered, also please review Full Orders    Code Status full  Disposition Plan: To be determined  Time spent in minutes : 42 minutes  Condition GUARDED   @SIGNATURE @

## 2019-01-12 NOTE — ED Triage Notes (Signed)
Pt brought via [facility] from a street corner. Pt had a witnessed seizure by a by stander. Full body seizure that lasted 1-2 mins. Pt denies hx of seizures. EMS stated pt was post tictal and only responded to verbal stimuli on scene. Pt states he drinks a couple of beers a day and his last drink was last night.  Pt is A&Ox3. Pt is sinus tach on monitor

## 2019-01-12 NOTE — ED Provider Notes (Signed)
[facility] EMERGENCY DEPARTMENT Provider Note   CSN: [id number] Arrival date & time: 01/12/19  0071     History   Chief Complaint Chief Complaint  Patient presents with  . Seizures    HPI Scott Choi is a 58 y.o. male.     Patient came to the emergency department because of a seizure.  Patient has a history of alcohol abuse.  He also states that he does have a history of rectal bleeding and throwing up blood  The history is provided by the patient. No language interpreter was used.  Seizures Seizure activity on arrival: yes   Seizure type:  Unable to specify Preceding symptoms: dizziness   Initial focality:  None Episode characteristics: no abnormal movements   Postictal symptoms: confusion   Return to baseline: yes   Severity:  Moderate Timing:  Once Progression:  Resolved Context: alcohol withdrawal     Past Medical History:  Diagnosis Date  . Alcoholic ([facility])   . Hypertension     There are no active problems to display for this patient.   History reviewed. No pertinent surgical history.      Home Medications    Prior to Admission medications   Medication Sig Start Date End Date Taking? Authorizing Provider  ibuprofen (ADVIL,MOTRIN) 200 MG tablet Take 200 mg by mouth every 6 (six) hours as needed for moderate pain.    [provider]    Family History No family history on file.  Social History Social History   Tobacco Use  . Smoking status: Former Games developer  . Smokeless tobacco: Never Used  Substance Use Topics  . Alcohol use: Yes    Alcohol/week: 42.0 standard drinks    Types: 28 Cans of beer, 14 Shots of liquor per week  . Drug use: Yes    Types: Marijuana     Allergies   Aspirin   Review of Systems Review of Systems  Constitutional: Negative for appetite change and fatigue.  HENT: Negative for congestion, ear discharge and sinus pressure.   Eyes: Negative for discharge.  Respiratory: Negative for  cough.   Cardiovascular: Negative for chest pain.  Gastrointestinal: Negative for abdominal pain and diarrhea.  Genitourinary: Negative for frequency and hematuria.  Musculoskeletal: Negative for back pain.  Skin: Negative for rash.  Neurological: Positive for seizures. Negative for headaches.  Psychiatric/Behavioral: Negative for hallucinations.     Physical Exam Updated Vital Signs BP (!) 162/98 (BP Location: Right Arm)   Pulse (!) 146   Temp 98.3 F (36.8 C) (Oral)   Resp (!) 22   Ht 5\' 9"  (1.753 m)   Wt 68 kg   SpO2 100%   BMI 22.14 kg/m   Physical Exam Vitals signs reviewed.  Constitutional:      Appearance: He is well-developed.  HENT:     Head: Normocephalic.  Eyes:     General: No scleral icterus.    Conjunctiva/sclera: Conjunctivae normal.  Neck:     Musculoskeletal: Neck supple.     Thyroid: No thyromegaly.  Cardiovascular:     Rate and Rhythm: Normal rate and regular rhythm.     Heart sounds: No murmur. No friction rub. No gallop.   Pulmonary:     Breath sounds: No stridor. No wheezing or rales.  Chest:     Chest wall: No tenderness.  Abdominal:     General: There is no distension.     Tenderness: There is no abdominal tenderness. There is no rebound.  Genitourinary:  Comments: Rectal exam heme-negative Musculoskeletal: Normal range of motion.  Lymphadenopathy:     Cervical: No cervical adenopathy.  Skin:    Findings: No erythema or rash.  Neurological:     Mental Status: He is alert and oriented to person, place, and time.     Motor: No abnormal muscle tone.     Coordination: Coordination normal.  Psychiatric:        Behavior: Behavior normal.      ED Treatments / Results  Labs (all labs ordered are listed, but only abnormal results are displayed) Labs Reviewed  CBC WITH DIFFERENTIAL/PLATELET - Abnormal; Notable for the following components:      Result Value   WBC 3.0 (*)    RBC 3.36 (*)    Hemoglobin 7.0 (*)    HCT 25.0 (*)     MCV 74.4 (*)    MCH 20.8 (*)    MCHC 28.0 (*)    RDW 20.8 (*)    Lymphs Abs 0.3 (*)    All other components within normal limits  COMPREHENSIVE METABOLIC PANEL - Abnormal; Notable for the following components:   Sodium 134 (*)    CO2 17 (*)    BUN <5 (*)    Total Protein 8.2 (*)    AST 125 (*)    Anion gap 19 (*)    All other components within normal limits  SARS CORONAVIRUS 2 (HOSPITAL ORDER, [place] [facility])  ETHANOL  RAPID URINE DRUG SCREEN, HOSP PERFORMED  VITAMIN B12  FOLATE  IRON AND TIBC  FERRITIN  RETICULOCYTES  MAGNESIUM  PHOSPHORUS  POC OCCULT BLOOD, ED    EKG EKG Interpretation  Date/Time:  Thursday January 12 2019 09:58:54 EDT Ventricular Rate:  106 PR Interval:    QRS Duration: 89 QT Interval:  346 QTC Calculation: 460 R Axis:   88 Text Interpretation:  Sinus tachycardia Confirmed by Virgel Manifold [phone]) on 01/12/2019 10:19:37 AM   Radiology Ct Head Wo Contrast  Result Date: 01/12/2019 CLINICAL DATA:  58 year old male with seizure witnessed by bystander. Altered mental status. EXAM: CT HEAD WITHOUT CONTRAST TECHNIQUE: Contiguous axial images were obtained from the base of the skull through the vertex without intravenous contrast. COMPARISON:  Head CT 10/22/2006 [facility] FINDINGS: Brain: Mild cerebral volume loss since 2008 appears generalized. No midline shift, ventriculomegaly, mass effect, evidence of mass lesion, intracranial hemorrhage or evidence of cortically based acute infarction. Gray-white matter differentiation is within normal limits throughout the brain. Vascular: Minimal Calcified atherosclerosis at the skull base. No suspicious intracranial vascular hyperdensity. Skull: No acute osseous abnormality identified. Sinuses/Orbits: Mild right maxillary sinus mucosal thickening, otherwise clear. Other: Chronic left posterior scalp lipoma. No acute orbit or scalp soft tissue findings. IMPRESSION: No acute  intracranial abnormality. Negative for age non contrast CT appearance of the brain. Electronically Signed   By: Genevie Ann M.D.   On: 01/12/2019 12:06    Procedures Procedures (including critical care time)  Medications Ordered in ED Medications  LORazepam (ATIVAN) tablet 1-4 mg (has no administration in time range)    Or  LORazepam (ATIVAN) injection 1-4 mg (has no administration in time range)  thiamine (VITAMIN B-1) tablet 100 mg (has no administration in time range)    Or  thiamine (B-1) injection 100 mg (has no administration in time range)  folic acid (FOLVITE) tablet 1 mg (has no administration in time range)  multivitamin with minerals tablet 1 tablet (has no administration in time range)  sodium chloride 0.9 % bolus 1,000 mL (0 mLs Intravenous Stopped 01/12/19 1327)  pantoprazole (PROTONIX) injection 40 mg (40 mg Intravenous Given 01/12/19 1120)  LORazepam (ATIVAN) injection 1 mg (1 mg Intravenous Given 01/12/19 1358)  ondansetron (ZOFRAN) injection 4 mg (4 mg Intravenous Given 01/12/19 1357)  sodium chloride 0.9 % bolus 1,000 mL (1,000 mLs Intravenous New Bag/Given 01/12/19 1357)     Initial Impression / Assessment and Plan / ED Course  I have reviewed the triage vital signs and the nursing notes.  Pertinent labs & imaging results that were available during my care of the patient were reviewed by me and considered in my medical decision making (see chart for details).    CRITICAL CARE Performed by: Bethann BerkshireJoseph Kmarion Rawl Total critical care time: 35 minutes Critical care time was exclusive of separately billable procedures and treating other patients. Critical care was necessary to treat or prevent imminent or life-threatening deterioration. Critical care was time spent personally by me on the following activities: development of treatment plan with patient and/or surrogate as well as nursing, discussions with consultants, evaluation of patient's response to treatment, examination of  patient, obtaining history from patient or surrogate, ordering and performing treatments and interventions, ordering and review of laboratory studies, ordering and review of radiographic studies, pulse oximetry and re-evaluation of patient's condition.    Patient is significantly anemic and has become more tachycardic when he stands up.  Suspect alcohol withdrawal symptoms.  Patient will be admitted to medicine    Final Clinical Impressions(s) / ED Diagnoses   Final diagnoses:  None    ED Discharge Orders    None       Bethann BerkshireZammit, Gaius Ishaq, MD 01/12/19 1423

## 2019-01-13 DIAGNOSIS — D509 Iron deficiency anemia, unspecified: Secondary | ICD-10-CM

## 2019-01-13 DIAGNOSIS — D500 Iron deficiency anemia secondary to blood loss (chronic): Secondary | ICD-10-CM | POA: Diagnosis present

## 2019-01-13 DIAGNOSIS — G40909 Epilepsy, unspecified, not intractable, without status epilepticus: Secondary | ICD-10-CM

## 2019-01-13 DIAGNOSIS — R945 Abnormal results of liver function studies: Secondary | ICD-10-CM

## 2019-01-13 DIAGNOSIS — F10230 Alcohol dependence with withdrawal, uncomplicated: Secondary | ICD-10-CM

## 2019-01-13 DIAGNOSIS — F1010 Alcohol abuse, uncomplicated: Secondary | ICD-10-CM

## 2019-01-13 DIAGNOSIS — F10239 Alcohol dependence with withdrawal, unspecified: Secondary | ICD-10-CM

## 2019-01-13 DIAGNOSIS — R569 Unspecified convulsions: Secondary | ICD-10-CM

## 2019-01-13 LAB — TYPE AND SCREEN
ABO/RH(D): O POS
Antibody Screen: NEGATIVE
Unit division: 0

## 2019-01-13 LAB — BPAM RBC
Blood Product Expiration Date: 202011102359
ISSUE DATE / TIME: 202010081923
Unit Type and Rh: 5100

## 2019-01-13 LAB — APTT: aPTT: 35 seconds (ref 24–36)

## 2019-01-13 LAB — HEMOGLOBIN
Hemoglobin: 7.5 g/dL — ABNORMAL LOW (ref 13.0–17.0)
Hemoglobin: 7.8 g/dL — ABNORMAL LOW (ref 13.0–17.0)
Hemoglobin: 8.2 g/dL — ABNORMAL LOW (ref 13.0–17.0)

## 2019-01-13 LAB — PROTIME-INR
INR: 1.1 (ref 0.8–1.2)
Prothrombin Time: 14.2 seconds (ref 11.4–15.2)

## 2019-01-13 MED ORDER — PEG-KCL-NACL-NASULF-NA ASC-C 100 G PO SOLR: 1.0000 | Freq: Once | ORAL | Status: DC

## 2019-01-13 MED ORDER — LORAZEPAM 1 MG PO TABS: 1.0000 mg | ORAL_TABLET | ORAL | Status: DC | PRN

## 2019-01-13 MED ORDER — BISACODYL 5 MG PO TBEC
20.0000 mg | DELAYED_RELEASE_TABLET | Freq: Once | ORAL | Status: AC
  Administered 2019-01-13: 20 mg via ORAL
  Filled 2019-01-13: qty 4

## 2019-01-13 MED ORDER — AMLODIPINE BESYLATE 5 MG PO TABS
5.0000 mg | ORAL_TABLET | Freq: Every day | ORAL | Status: DC
  Administered 2019-01-13 – 2019-01-15 (×2): 5 mg via ORAL
  Filled 2019-01-13 (×2): qty 1

## 2019-01-13 MED ORDER — BOOST / RESOURCE BREEZE PO LIQD CUSTOM
1.0000 | Freq: Three times a day (TID) | ORAL | Status: DC
  Administered 2019-01-13 – 2019-01-15 (×4): 1 via ORAL

## 2019-01-13 MED ORDER — LORAZEPAM 2 MG/ML IJ SOLN
1.0000 mg | INTRAMUSCULAR | Status: DC | PRN
  Filled 2019-01-13: qty 1

## 2019-01-13 MED ORDER — PANTOPRAZOLE SODIUM 40 MG PO TBEC
40.0000 mg | DELAYED_RELEASE_TABLET | Freq: Two times a day (BID) | ORAL | Status: DC
  Administered 2019-01-13 – 2019-01-15 (×3): 40 mg via ORAL
  Filled 2019-01-13 (×3): qty 1

## 2019-01-13 MED ORDER — METOCLOPRAMIDE HCL 5 MG/ML IJ SOLN
10.0000 mg | Freq: Once | INTRAMUSCULAR | Status: AC
  Administered 2019-01-14: 10 mg via INTRAVENOUS
  Filled 2019-01-13: qty 2

## 2019-01-13 MED ORDER — LEVETIRACETAM 500 MG PO TABS
500.0000 mg | ORAL_TABLET | Freq: Two times a day (BID) | ORAL | Status: DC
  Administered 2019-01-14 – 2019-01-15 (×2): 500 mg via ORAL
  Filled 2019-01-13 (×2): qty 1

## 2019-01-13 MED ORDER — LORAZEPAM 2 MG/ML IJ SOLN: 1.0000 mg | INTRAMUSCULAR | Status: DC | PRN

## 2019-01-13 MED ORDER — PEG-KCL-NACL-NASULF-NA ASC-C 100 G PO SOLR
0.5000 | Freq: Once | ORAL | Status: AC
  Administered 2019-01-13: 100 g via ORAL
  Filled 2019-01-13: qty 1

## 2019-01-13 MED ORDER — METOCLOPRAMIDE HCL 5 MG/ML IJ SOLN
10.0000 mg | Freq: Once | INTRAMUSCULAR | Status: AC
  Administered 2019-01-13: 10 mg via INTRAVENOUS
  Filled 2019-01-13: qty 2

## 2019-01-13 MED ORDER — PEG-KCL-NACL-NASULF-NA ASC-C 100 G PO SOLR: 0.5000 | Freq: Once | ORAL | Status: DC

## 2019-01-13 MED ORDER — LEVETIRACETAM IN NACL 1000 MG/100ML IV SOLN
1000.0000 mg | Freq: Once | INTRAVENOUS | Status: AC
  Administered 2019-01-13: 1000 mg via INTRAVENOUS
  Filled 2019-01-13: qty 100

## 2019-01-13 NOTE — Progress Notes (Signed)
PROGRESS NOTE    Scott Choi  ONG:[id number] DOB: 06/18/1960 DOA: 01/12/2019 PCP: Patient, No Pcp Per    Brief Narrative:  Scott Choi  is a 58 y.o. male, with past medical history significant for alcoholism, brought to our facility by EMS for witnessed seizure by a bystander.  Patient reports his last drink was yesterday (10/7).  EMS reported postictal state.  In the emergency room mental status continued to deteriorate and he was found to be anemic.  Patient has history of taking nonsteroidals.  His stool guaiac was negative but his hemoglobin was 7.  Patient reports dark tarry stools and occasional coffee ground emesis with occasional bright red blood.  Admitted for blood transfusion and monitoring with CIWA protocol.  Received one unit packed red blood cells.  Trending hemoglobin.  GI and neurology consulted for further evaluations of his seizure and apparent GI bleeding.    Patient reports seizures in the past, one a couple months ago and at least one other in the distant past.  He had only been without alcohol for 24 hours when the seizure occurred.  He does not recall whether prior seizures were related to alcohol withdrawal but thinks they were not.  Patient expresses some interest in stopping drinking.  States he can stop if he really wants to, but would be willing to consider counseling or other forms of help with this.   Assessment & Plan:   Principal Problem:   Iron deficiency anemia due to chronic blood loss Active Problems:   Seizure (HCC)   Alcohol dependence with uncomplicated withdrawal (HCC)   Iron deficiency anemia: Apparent chronic blood loss secondary to NSAID use as well as excessive daily alcohol intake.  Hemoglobin was 7 on presentation, received 1 unit packed red cells. -Trend H&H -Clear liquid diet -IV PPI twice daily -GI consulted, plan for EGD and colonoscopy hopefully tomorrow  Seizure: Etiology to be determined, consider chronic seizure disorder versus  possible alcohol withdrawal induced, however last drink was less than 24 hours prior to the seizure.  Witnessed generalized tonic-clonic seizure on street corner, brought in by EMS in postictal state.  Now back to baseline mentation.  Discussed case with neurologist, Dr. Wilford CornerArora.  Can start antiepileptic therapy while inpatient and follow-up with neurology as outpatient since he is at baseline.  No need for EEG while inpatient. - Load Keppra 1 g IV today - Start oral Keppra 500 twice daily tomorrow - Follow-up with neurology as outpatient  Alcohol dependence: Patient reported drinking 80 ounces malt liquor and up to a pint of gin daily.  Last drink was approximately 24 hours prior to presentation.  He denies history of DTs.   - CIWA protocol with as needed Ativan -Started on thiamine, folate and multivitamin - Counseled patient on alcohol cessation   DVT prophylaxis: SCD's Code Status:   Code Status: Full Code  Family Communication: none at bedside Disposition Plan: home   Consultants:   Gastroenterology  Procedures:   None   Antimicrobials:   None     Subjective: Patient seen and examined, awake and lying in bed.  Denies pain or discomfort, headache, palpitations, chest pain, SOB, N/V/D.  States he feels well today.  Reported several month or more history of dark stools and occasional vomiting with blood seen.  No acute events reported overnight.  Objective: Vitals:   01/12/19 2330 01/13/19 0052 01/13/19 0511 01/13/19 0830  BP: (!) 145/96 (!) 150/91 (!) 151/89 (!) 155/80  Pulse: 88 77 98 90  Resp: 16 18 18  (!) 21  Temp:  98.5 F (36.9 C) 98.8 F (37.1 C) 98.7 F (37.1 C)  TempSrc:  Oral  Oral  SpO2: 100% 100% 100% 100%  Weight:      Height:        Intake/Output Summary (Last 24 hours) at 01/13/2019 1449 Last data filed at 01/13/2019 0054 Gross per 24 hour  Intake 1638 ml  Output 1600 ml  Net 38 ml   Filed Weights   01/12/19 0959  Weight: 68 kg    Examination:   General exam: Appears calm and comfortable, frail, underweight Respiratory system: Clear to auscultation. Respiratory effort normal. Cardiovascular system: S1 & S2 heard, RRR. No JVD, murmurs, rubs, gallops or clicks. No pedal edema. Gastrointestinal system: Abdomen is nondistended, soft and nontender.  Normal bowel sounds heard. Central nervous system: Alert and oriented. No focal neurological deficits. Extremities: Symmetric 5 x 5 power, no edema. Skin: dry, intact, normal temperature Psychiatry: Judgement and insight appear normal. Mood & affect appropriate.     Data Reviewed: I have personally reviewed following labs and imaging studies  CBC: Recent Labs  Lab 01/12/19 1022 01/12/19 1520 01/12/19 1747 01/12/19 2347 01/13/19 0608  WBC 3.0*  --   --   --   --   NEUTROABS 2.1  --   --   --   --   HGB 7.0* 8.5* 6.6* 7.8* 7.5*  HCT 25.0* 25.0*  --   --   --   MCV 74.4*  --   --   --   --   PLT 243  --   --   --   --    Basic Metabolic Panel: Recent Labs  Lab 01/12/19 1022 01/12/19 1500 01/12/19 1520  NA 134*  --  135  K 3.5  --  3.5  CL 98  --   --   CO2 17*  --   --   GLUCOSE 98  --   --   BUN <5*  --   --   CREATININE 0.90  --   --   CALCIUM 9.1  --   --   MG  --  1.5*  --   PHOS  --  3.6  --    GFR: Estimated Creatinine Clearance: 88.1 mL/min (by C-G formula based on SCr of 0.9 mg/dL). Liver Function Tests: Recent Labs  Lab 01/12/19 1022  AST 125*  ALT 44  ALKPHOS 125  BILITOT 0.7  PROT 8.2*  ALBUMIN 3.5   No results for input(s): LIPASE, AMYLASE in the last 168 hours. No results for input(s): AMMONIA in the last 168 hours. Coagulation Profile: Recent Labs  Lab 01/13/19 0935  INR 1.1   Cardiac Enzymes: No results for input(s): CKTOTAL, CKMB, CKMBINDEX, TROPONINI in the last 168 hours. BNP (last 3 results) No results for input(s): PROBNP in the last 8760 hours. HbA1C: No results for input(s): HGBA1C in the last 72 hours. CBG: No results for  input(s): GLUCAP in the last 168 hours. Lipid Profile: No results for input(s): CHOL, HDL, LDLCALC, TRIG, CHOLHDL, LDLDIRECT in the last 72 hours. Thyroid Function Tests: No results for input(s): TSH, T4TOTAL, FREET4, T3FREE, THYROIDAB in the last 72 hours. Anemia Panel: Recent Labs    01/12/19 1459  VITAMINB12 158*  FOLATE 15.2  FERRITIN 3*  TIBC 515*  IRON 25*  RETICCTPCT 1.7   Sepsis Labs: No results for input(s): PROCALCITON, LATICACIDVEN in the last 168 hours.  Recent Results (from the  past 240 hour(s))  SARS Coronavirus 2 by RT PCR (hospital order, performed in [facility] hospital lab) Nasopharyngeal Nasopharyngeal Swab     Status: None   Collection Time: 01/12/19  2:15 PM   Specimen: Nasopharyngeal Swab  Result Value Ref Range Status   SARS Coronavirus 2 NEGATIVE NEGATIVE Final    Comment: (NOTE) If result is NEGATIVE SARS-CoV-2 target nucleic acids are NOT DETECTED. The SARS-CoV-2 RNA is generally detectable in upper and lower  respiratory specimens during the acute phase of infection. The lowest  concentration of SARS-CoV-2 viral copies this assay can detect is 250  copies / mL. A negative result does not preclude SARS-CoV-2 infection  and should not be used as the sole basis for treatment or other  patient management decisions.  A negative result may occur with  improper specimen collection / handling, submission of specimen other  than nasopharyngeal swab, presence of viral mutation(s) within the  areas targeted by this assay, and inadequate number of viral copies  (<250 copies / mL). A negative result must be combined with clinical  observations, patient history, and epidemiological information. If result is POSITIVE SARS-CoV-2 target nucleic acids are DETECTED. The SARS-CoV-2 RNA is generally detectable in upper and lower  respiratory specimens dur ing the acute phase of infection.  Positive  results are indicative of active infection with SARS-CoV-2.   Clinical  correlation with patient history and other diagnostic information is  necessary to determine patient infection status.  Positive results do  not rule out bacterial infection or co-infection with other viruses. If result is PRESUMPTIVE POSTIVE SARS-CoV-2 nucleic acids MAY BE PRESENT.   A presumptive positive result was obtained on the submitted specimen  and confirmed on repeat testing.  While 2019 novel coronavirus  (SARS-CoV-2) nucleic acids may be present in the submitted sample  additional confirmatory testing may be necessary for epidemiological  and / or clinical management purposes  to differentiate between  SARS-CoV-2 and other Sarbecovirus currently known to infect humans.  If clinically indicated additional testing with an alternate test  methodology [phone]) is advised. The SARS-CoV-2 RNA is generally  detectable in upper and lower respiratory sp ecimens during the acute  phase of infection. The expected result is Negative. Fact Sheet for Patients:  BoilerBrush.com.cy Fact Sheet for Healthcare Providers: https://pope.com/ This test is not yet approved or cleared by the [place] FDA and has been authorized for detection and/or diagnosis of SARS-CoV-2 by FDA under an Emergency Use Authorization (EUA).  This EUA will remain in effect (meaning this test can be used) for the duration of the COVID-19 declaration under Section 564(b)(1) of the Act, 21 U.S.C. section 360bbb-3(b)(1), unless the authorization is terminated or revoked sooner. Performed at [facility], [address] [place], [place] [address]          Radiology Studies: Ct Head Wo Contrast  Result Date: 01/12/2019 CLINICAL DATA:  58 year old male with seizure witnessed by bystander. Altered mental status. EXAM: CT HEAD WITHOUT CONTRAST TECHNIQUE: Contiguous axial images were obtained from the base of the skull through the vertex without  intravenous contrast. COMPARISON:  Head CT 10/22/2006 [facility] FINDINGS: Brain: Mild cerebral volume loss since 2008 appears generalized. No midline shift, ventriculomegaly, mass effect, evidence of mass lesion, intracranial hemorrhage or evidence of cortically based acute infarction. Gray-white matter differentiation is within normal limits throughout the brain. Vascular: Minimal Calcified atherosclerosis at the skull base. No suspicious intracranial vascular hyperdensity. Skull: No acute osseous abnormality  identified. Sinuses/Orbits: Mild right maxillary sinus mucosal thickening, otherwise clear. Other: Chronic left posterior scalp lipoma. No acute orbit or scalp soft tissue findings. IMPRESSION: No acute intracranial abnormality. Negative for age non contrast CT appearance of the brain. Electronically Signed   By: Odessa Fleming M.D.   On: 01/12/2019 12:06        Scheduled Meds: . sodium chloride   Intravenous Once  . feeding supplement  1 Container Oral TID BM  . folic acid  1 mg Oral Daily  . multivitamin with minerals  1 tablet Oral Daily  . pantoprazole  40 mg Oral BID  . sodium chloride flush  3 mL Intravenous Q12H  . thiamine  100 mg Oral Daily   Or  . thiamine  100 mg Intravenous Daily   Continuous Infusions: . sodium chloride       LOS: 1 day    Time spent: 40-45 min    Pennie Banter, MD [facility] Pager [phone]  If 7PM-7AM, please contact night-coverage [email or web address] Password [email or web address] 01/13/2019, 2:49 PM

## 2019-01-13 NOTE — Plan of Care (Signed)
  Problem: Health Behavior/Discharge Planning: Goal: Ability to manage health-related needs will improve Outcome: Progressing   

## 2019-01-13 NOTE — H&P (View-Only) (Signed)
[facility] Consult: 9:28 AM 01/13/2019  LOS: 1 day    Referring Provider: Janora Norlander  Primary Care Physician:  Patient, No Pcp Per Primary Gastroenterologist:  unassigned     Reason for Consultation: Anemia.  Tarry stools.   HPI: Scott Choi is a 58 y.o. male.  Hx alcoholism.  Seizures.  Had a seizure on a street corner, witnessed by a bystander.  This was generalized tonic-clonic lasting 1 to 2 minutes.  Patient reports a mild, brief seizure a couple of months ago and seizures several years ago "back in the day".  For several months, possibly up to a year he has had heartburn, epigastric discomfort when he eats spicy type foods.  Sometimes this leads to nausea and vomiting which is mostly bilious but has seen streaks of blood at times but no frank hematemesis.  The heartburn is relieved by Maalox but he does not always take this.  For several months he is seen intermittent black stools.  Stools tend to be loose in general.  He has a good appetite and eats well.  He recalls weighing 196#1 year ago and currently weighs 149 #.  Does not use aspirin or NSAIDs.  He was warned in the past to avoid those by a physician though he has no primary care physician.  Denies unusual bleeding or bruising.  Has never been told he had low blood counts or required transfusions.  Has had some increased fatigue over the last month or so, nothing profound.  No dizziness or lightheadedness.  No palpitations or angina.  No blood in his urine.  Hgb 7 >> 8.5 >> 6.6 >> 7.5.  Has received 1 PRBC thus far.  MCV 74.  WBCs 3.  Platelets 243. Iron 25, ferritin 3.  B12 also low at 158. AST/ALT 125/44.  Normal BUN and alkaline phosphatase.  Slight hyponatremia at 134, now corrected. FOBT negative T/INR has not been assayed. Tox  screen positive for THC.  Alcohol level less than 10. CT of the head Shows nothing acute or remarkable.  Patient works as a Administrator, yard care.  He drinks 80 ounces malt liquor daily and consumes up to a pint of gin daily.  Lives with his brother-in-law in [place]. Family history pertinent for some sort of cancer in his mother who also had cardiomyopathy.  She died at 10.  His father died of throat cancer in his 58s, he was a smoker.  1 of his brothers died in his late 39s with complications of diabetes.  He is 1 of 6 children.    Past Medical History:  Diagnosis Date  . Alcoholic (HCC)   . Hypertension     History reviewed. No pertinent surgical history.  Prior to Admission medications   Not on File    Scheduled Meds: . sodium chloride   Intravenous Once  . folic acid  1 mg Oral Daily  . multivitamin with minerals  1 tablet Oral Daily  . pantoprazole (PROTONIX) IV  40 mg Intravenous Q12H  . sodium chloride flush  3 mL  Intravenous Q12H  . thiamine  100 mg Oral Daily   Or  . thiamine  100 mg Intravenous Daily   Infusions: . sodium chloride     PRN Meds: sodium chloride, acetaminophen **OR** acetaminophen, LORazepam **OR** LORazepam, ondansetron **OR** ondansetron (ZOFRAN) IV, sodium chloride flush   Allergies as of 01/12/2019 - Review Complete 01/12/2019  Allergen Reaction Noted  . Aspirin  12/11/2015    No family history on file.  Social History   Socioeconomic History  . Marital status: Married    Spouse name: Not on file  . Number of children: Not on file  . Years of education: Not on file  . Highest education level: Not on file  Occupational History  . Not on file  Social Needs  . Financial resource strain: Not on file  . Food insecurity    Worry: Not on file    Inability: Not on file  . Transportation needs    Medical: Not on file    Non-medical: Not on file  Tobacco Use  . Smoking status: Former Smoker  . Smokeless tobacco: Never Used   Substance and Sexual Activity  . Alcohol use: Yes    Alcohol/week: 42.0 standard drinks    Types: 28 Cans of beer, 14 Shots of liquor per week  . Drug use: Yes    Types: Marijuana  . Sexual activity: Never  Lifestyle  . Physical activity    Days per week: Not on file    Minutes per session: Not on file  . Stress: Not on file  Relationships  . Social connections    Talks on phone: Not on file    Gets together: Not on file    Attends religious service: Not on file    Active member of club or organization: Not on file    Attends meetings of clubs or organizations: Not on file    Relationship status: Not on file  . Intimate partner violence    Fear of current or ex partner: Not on file    Emotionally abused: Not on file    Physically abused: Not on file    Forced sexual activity: Not on file  Other Topics Concern  . Not on file  Social History Narrative  . Not on file    REVIEW OF SYSTEMS: Constitutional: See HPI. ENT:  No nose bleeds Pulm: No cough.  Some increased dyspnea on exertion. CV:  No palpitations, no LE edema.  No angina. GU:  No hematuria, no frequency GI: Dysphagia. Heme: See HPI Transfusions: None Neuro: Witnessed seizure as per HPI.  No headaches, no peripheral tingling or numbness Derm:  No itching, no rash or sores.  Endocrine:  No sweats or chills.  No polyuria or dysuria Immunization: Not queried. Travel:  None beyond local counties in last few months.    PHYSICAL EXAM: Vital signs in last 24 hours: Vitals:   01/13/19 0511 01/13/19 0830  BP: (!) 151/89 (!) 155/80  Pulse: 98 90  Resp: 18 (!) 21  Temp: 98.8 F (37.1 C) 98.7 F (37.1 C)  SpO2: 100% 100%   Wt Readings from Last 3 Encounters:  01/12/19 68 kg  12/11/15 68 kg    General: Thin, pleasant, cooperative, comfortable AAM Head: No facial asymmetry or swelling.  No signs of head trauma. Eyes: No scleral icterus.  No conjunctival pallor.  EOMI. Ears: Not hard of hearing Nose: No  congestion or discharge Mouth: Oropharynx moist, pink, clear mucous membranes.  Tongue midline.    Poor dentition.  At least half of his teeth and most of his molars are missing. Neck: No JVD, no thyromegaly, no masses. Lungs: No labored breathing, no cough.  Lungs clear bilaterally with good breath sounds. Heart: RRR.  No MRG.  S1, S2 present. Abdomen: Soft.  Nondistended.  Mild tenderness in the epigastrium down towards the umbilicus at the mid abdomen.  No guarding or rebound.  Active bowel sounds.  No HSM, masses, bruits..   Rectal: Deferred.  Performed in ED yesterday FOBT negative. Musc/Skeltl: No joint redness, swelling or gross deformity. Extremities: No CCE. Neurologic: Alert.  Oriented x3.  Fair historian.  No tremors, no limb weakness.  No involuntary movement. Skin: No suspicious lesions.  No rashes or sores. Nodes: No cervical adenopathy. Psych: Operative, calm, pleasant.  Fluid speech.  Intake/Output from previous day: 10/08 0701 - 10/09 0700 In: 2638 [Blood:638; IV Piggyback:2000] Out: 1600 [Urine:1600] Intake/Output this shift: No intake/output data recorded.  LAB RESULTS: Recent Labs    01/12/19 1022 01/12/19 1520 01/12/19 1747 01/12/19 2347 01/13/19 0608  WBC 3.0*  --   --   --   --   HGB 7.0* 8.5* 6.6* 7.8* 7.5*  HCT 25.0* 25.0*  --   --   --   PLT 243  --   --   --   --    BMET Lab Results  Component Value Date   NA 135 01/12/2019   NA 134 (L) 01/12/2019   NA 143 12/11/2015   K 3.5 01/12/2019   K 3.5 01/12/2019   K 3.5 12/11/2015   CL 98 01/12/2019   CL 100 (L) 12/11/2015   CL 100 05/20/2009   CO2 17 (L) 01/12/2019   CO2 25 05/20/2009   GLUCOSE 98 01/12/2019   GLUCOSE 95 12/11/2015   GLUCOSE 103 (H) 05/20/2009   BUN <5 (L) 01/12/2019   BUN 3 (L) 12/11/2015   BUN 8 05/20/2009   CREATININE 0.90 01/12/2019   CREATININE 1.10 12/11/2015   CREATININE 0.88 05/20/2009   CALCIUM 9.1 01/12/2019   CALCIUM 8.4 05/20/2009   LFT Recent Labs     01/12/19 1022  PROT 8.2*  ALBUMIN 3.5  AST 125*  ALT 44  ALKPHOS 125  BILITOT 0.7   PT/INR No results found for: INR, PROTIME Hepatitis Panel No results for input(s): HEPBSAG, HCVAB, HEPAIGM, HEPBIGM in the last 72 hours. C-Diff No components found for: CDIFF Lipase  No results found for: LIPASE  Drugs of Abuse     Component Value Date/Time   LABOPIA NONE DETECTED 01/12/2019 1415   COCAINSCRNUR NONE DETECTED 01/12/2019 1415   LABBENZ NONE DETECTED 01/12/2019 1415   AMPHETMU NONE DETECTED 01/12/2019 1415   THCU POSITIVE (A) 01/12/2019 1415   LABBARB NONE DETECTED 01/12/2019 1415     RADIOLOGY STUDIES: Ct Head Wo Contrast  Result Date: 01/12/2019 CLINICAL DATA:  58 year old male with seizure witnessed by bystander. Altered mental status. EXAM: CT HEAD WITHOUT CONTRAST TECHNIQUE: Contiguous axial images were obtained from the base of the skull through the vertex without intravenous contrast. COMPARISON:  Head [facility] FINDINGS: Brain: Mild cerebral volume loss since 2008 appears generalized. No midline shift, ventriculomegaly, mass effect, evidence of mass lesion, intracranial hemorrhage or evidence of cortically based acute infarction. Gray-white matter differentiation is within normal limits throughout the brain. Vascular: Minimal Calcified atherosclerosis at the skull base. No suspicious intracranial vascular hyperdensity. Skull: No acute osseous abnormality identified. Sinuses/Orbits: Mild right maxillary sinus mucosal thickening, otherwise  clear. Other: Chronic left posterior scalp lipoma. No acute orbit or scalp soft tissue findings. IMPRESSION: No acute intracranial abnormality. Negative for age non contrast CT appearance of the brain. Electronically Signed   By: Genevie Ann M.D.   On: 01/12/2019 12:06     IMPRESSION:   *    Iron deficiency anemia.  1 of 1 FOBT tests negative.  Reports of intermittent black stool and scant hematemesis along  with pyrosis, weight loss. Rule out peptic ulcer disease, rule out neoplasia.  *    Seizure disorder.  This was not in the setting of alcohol abstinence.  Prior history of seizures but never medicated for this.  Head CT unremarkable.  *    Leukopenia.  *     COVID-19 negative  *    Alcohol abuse.  Alcohol level not elevated at arrival.  Tox screen positive for THC.  *    Elevated AST, transaminase pattern consistent with alcohol abuse, possible mild alcoholic hepatitis.    PLAN:     *    EGD, colonoscopy.  This for evaluation of his upper GI symptoms, anemia and screening for colon polyps in 58 year old. Risks, benefits, study details discussed with patient and he is agreeable to proceed. ?  Can we do this tomorrow. Continue clear liquid diet and determine if we can proceed with studies tomorrow in which case he will undergo bowel prep PT/INR/PTT collected, results pending. Decrease frequency of blood draws as he currently has hemoglobins drawn every 6 hours    Azucena Freed  01/13/2019, 9:28 AM Phone [phone]   Attending physician's note   I have taken a history, examined the patient and reviewed the chart. I agree with the Advanced Practitioner's note, impression and recommendations.  58 year old male with history of chronic alcohol abuse admitted after an epsiode of seizures, and has severe iron deficiency anemia  No evidence of cirrhosis based on exam and labs  Elevated AST secondary to ETOH. Normal Bilirubin Check PT and INR  COVID 19 negative  We will plan for EGD and colonoscopy  for further evaluation of severe iron deficiency anemia.  We will tentatively plan procedures for tomorrow if no further seizures or has active alcohol withdrawal Monitor for alcohol withdrawal and supportive care per primary team  Clear liquids and start bowel prep this PM  The risks and benefits as well as alternatives of endoscopic procedure(s) have been discussed and reviewed.  All questions answered. The patient agrees to proceed.   Damaris Hippo , MD [phone]

## 2019-01-13 NOTE — Progress Notes (Signed)
Initial Nutrition Assessment  DOCUMENTATION CODES:   Not applicable(suspect malnutriton)  INTERVENTION:    Boost Breeze po TID, each supplement provides 250 kcal and 9 grams of protein  MVI daily   NUTRITION DIAGNOSIS:   Increased nutrient needs related to acute illness as evidenced by estimated needs.  GOAL:   Patient will meet greater than or equal to 90% of their needs  MONITOR:   PO intake, Supplement acceptance, Diet advancement, Weight trends, Labs, I & O's  REASON FOR ASSESSMENT:   Malnutrition Screening Tool    ASSESSMENT:   Patient with PMH significant for ETOH abuse and hypertension. Presents this admission with ETOH withdrawal/seizure and possible GI bleed.   RD working remotely.  Plan for EGD/colonscopy per GI.   Attempted to call pt via phone x3, received busy tone each time. Pt reports to GI his appetite was okay PTA. Will attempt to obtain more information on daily intake if possible. He drinks 80 oz of malt liquor and up to a pint of gin daily. No meal completions charted this admission. Will provide supplement to maximize kcal and protein.   Per pt reports he weighed 196 lb one year ago and 149 lb this admission (24% wt loss in one year, significant for time frame). Unable to confirm as weight records lack history. Suspect malnutrition but unable to diagnose without dietary recall or NFPE.   I/O: +1,038 ml since admit  UOP: 1,600 ml x 24 hrs   Medications: folic acid, MVI with minerals, thiamine Labs: Na 134 (L)   Diet Order:   Diet Order            Diet clear liquid Room service appropriate? Yes; Fluid consistency: Thin  Diet effective now              EDUCATION NEEDS:   Not appropriate for education at this time  Skin:  Skin Assessment: Reviewed RN Assessment  Last BM:  10/8  Height:   Ht Readings from Last 1 Encounters:  01/12/19 5\' 9"  (1.753 m)    Weight:   Wt Readings from Last 1 Encounters:  01/12/19 68 kg    Ideal  Body Weight:  72.7 kg  BMI:  Body mass index is 22.14 kg/m.  Estimated Nutritional Needs:   Kcal:  2050- 2250 kcal  Protein:  105-120 grams  Fluid:  >/= 2 L/day   Mariana Single RD, LDN Clinical Nutrition Pager # - [phone]

## 2019-01-13 NOTE — Consult Note (Addendum)
[facility] Consult: 9:28 AM 01/13/2019  LOS: 1 day    Referring Provider: Janora Norlander  Primary Care Physician:  Patient, No Pcp Per Primary Gastroenterologist:  unassigned     Reason for Consultation: Anemia.  Tarry stools.   HPI: Scott Choi is a 58 y.o. male.  Hx alcoholism.  Seizures.  Had a seizure on a street corner, witnessed by a bystander.  This was generalized tonic-clonic lasting 1 to 2 minutes.  Patient reports a mild, brief seizure a couple of months ago and seizures several years ago "back in the day".  For several months, possibly up to a year he has had heartburn, epigastric discomfort when he eats spicy type foods.  Sometimes this leads to nausea and vomiting which is mostly bilious but has seen streaks of blood at times but no frank hematemesis.  The heartburn is relieved by Maalox but he does not always take this.  For several months he is seen intermittent black stools.  Stools tend to be loose in general.  He has a good appetite and eats well.  He recalls weighing 196#1 year ago and currently weighs 149 #.  Does not use aspirin or NSAIDs.  He was warned in the past to avoid those by a physician though he has no primary care physician.  Denies unusual bleeding or bruising.  Has never been told he had low blood counts or required transfusions.  Has had some increased fatigue over the last month or so, nothing profound.  No dizziness or lightheadedness.  No palpitations or angina.  No blood in his urine.  Hgb 7 >> 8.5 >> 6.6 >> 7.5.  Has received 1 PRBC thus far.  MCV 74.  WBCs 3.  Platelets 243. Iron 25, ferritin 3.  B12 also low at 158. AST/ALT 125/44.  Normal BUN and alkaline phosphatase.  Slight hyponatremia at 134, now corrected. FOBT negative T/INR has not been assayed. Tox  screen positive for THC.  Alcohol level less than 10. CT of the head Shows nothing acute or remarkable.  Patient works as a Administrator, yard care.  He drinks 80 ounces malt liquor daily and consumes up to a pint of gin daily.  Lives with his brother-in-law in [place]. Family history pertinent for some sort of cancer in his mother who also had cardiomyopathy.  She died at 10.  His father died of throat cancer in his 58s, he was a smoker.  1 of his brothers died in his late 39s with complications of diabetes.  He is 1 of 6 children.    Past Medical History:  Diagnosis Date  . Alcoholic (HCC)   . Hypertension     History reviewed. No pertinent surgical history.  Prior to Admission medications   Not on File    Scheduled Meds: . sodium chloride   Intravenous Once  . folic acid  1 mg Oral Daily  . multivitamin with minerals  1 tablet Oral Daily  . pantoprazole (PROTONIX) IV  40 mg Intravenous Q12H  . sodium chloride flush  3 mL  Intravenous Q12H  . thiamine  100 mg Oral Daily   Or  . thiamine  100 mg Intravenous Daily   Infusions: . sodium chloride     PRN Meds: sodium chloride, acetaminophen **OR** acetaminophen, LORazepam **OR** LORazepam, ondansetron **OR** ondansetron (ZOFRAN) IV, sodium chloride flush   Allergies as of 01/12/2019 - Review Complete 01/12/2019  Allergen Reaction Noted  . Aspirin  12/11/2015    No family history on file.  Social History   Socioeconomic History  . Marital status: Married    Spouse name: Not on file  . Number of children: Not on file  . Years of education: Not on file  . Highest education level: Not on file  Occupational History  . Not on file  Social Needs  . Financial resource strain: Not on file  . Food insecurity    Worry: Not on file    Inability: Not on file  . Transportation needs    Medical: Not on file    Non-medical: Not on file  Tobacco Use  . Smoking status: Former Games developer  . Smokeless tobacco: Never Used   Substance and Sexual Activity  . Alcohol use: Yes    Alcohol/week: 42.0 standard drinks    Types: 28 Cans of beer, 14 Shots of liquor per week  . Drug use: Yes    Types: Marijuana  . Sexual activity: Never  Lifestyle  . Physical activity    Days per week: Not on file    Minutes per session: Not on file  . Stress: Not on file  Relationships  . Social Musician on phone: Not on file    Gets together: Not on file    Attends religious service: Not on file    Active member of club or organization: Not on file    Attends meetings of clubs or organizations: Not on file    Relationship status: Not on file  . Intimate partner violence    Fear of current or ex partner: Not on file    Emotionally abused: Not on file    Physically abused: Not on file    Forced sexual activity: Not on file  Other Topics Concern  . Not on file  Social History Narrative  . Not on file    REVIEW OF SYSTEMS: Constitutional: See HPI. ENT:  No nose bleeds Pulm: No cough.  Some increased dyspnea on exertion. CV:  No palpitations, no LE edema.  No angina. GU:  No hematuria, no frequency GI: Dysphagia. Heme: See HPI Transfusions: None Neuro: Witnessed seizure as per HPI.  No headaches, no peripheral tingling or numbness Derm:  No itching, no rash or sores.  Endocrine:  No sweats or chills.  No polyuria or dysuria Immunization: Not queried. Travel:  None beyond local counties in last few months.    PHYSICAL EXAM: Vital signs in last 24 hours: Vitals:   01/13/19 0511 01/13/19 0830  BP: (!) 151/89 (!) 155/80  Pulse: 98 90  Resp: 18 (!) 21  Temp: 98.8 F (37.1 C) 98.7 F (37.1 C)  SpO2: 100% 100%   Wt Readings from Last 3 Encounters:  01/12/19 68 kg  12/11/15 68 kg    General: Thin, pleasant, cooperative, comfortable AAM Head: No facial asymmetry or swelling.  No signs of head trauma. Eyes: No scleral icterus.  No conjunctival pallor.  EOMI. Ears: Not hard of hearing Nose: No  congestion or discharge Mouth: Oropharynx moist, pink, clear mucous membranes.  Tongue midline.  Poor dentition.  At least half of his teeth and most of his molars are missing. Neck: No JVD, no thyromegaly, no masses. Lungs: No labored breathing, no cough.  Lungs clear bilaterally with good breath sounds. Heart: RRR.  No MRG.  S1, S2 present. Abdomen: Soft.  Nondistended.  Mild tenderness in the epigastrium down towards the umbilicus at the mid abdomen.  No guarding or rebound.  Active bowel sounds.  No HSM, masses, bruits..   Rectal: Deferred.  Performed in ED yesterday FOBT negative. Musc/Skeltl: No joint redness, swelling or gross deformity. Extremities: No CCE. Neurologic: Alert.  Oriented x3.  Fair historian.  No tremors, no limb weakness.  No involuntary movement. Skin: No suspicious lesions.  No rashes or sores. Nodes: No cervical adenopathy. Psych: Operative, calm, pleasant.  Fluid speech.  Intake/Output from previous day: 10/08 0701 - 10/09 0700 In: 2638 [Blood:638; IV Piggyback:2000] Out: 1600 [Urine:1600] Intake/Output this shift: No intake/output data recorded.  LAB RESULTS: Recent Labs    01/12/19 1022 01/12/19 1520 01/12/19 1747 01/12/19 2347 01/13/19 0608  WBC 3.0*  --   --   --   --   HGB 7.0* 8.5* 6.6* 7.8* 7.5*  HCT 25.0* 25.0*  --   --   --   PLT 243  --   --   --   --    BMET Lab Results  Component Value Date   NA 135 01/12/2019   NA 134 (L) 01/12/2019   NA 143 12/11/2015   K 3.5 01/12/2019   K 3.5 01/12/2019   K 3.5 12/11/2015   CL 98 01/12/2019   CL 100 (L) 12/11/2015   CL 100 05/20/2009   CO2 17 (L) 01/12/2019   CO2 25 05/20/2009   GLUCOSE 98 01/12/2019   GLUCOSE 95 12/11/2015   GLUCOSE 103 (H) 05/20/2009   BUN <5 (L) 01/12/2019   BUN 3 (L) 12/11/2015   BUN 8 05/20/2009   CREATININE 0.90 01/12/2019   CREATININE 1.10 12/11/2015   CREATININE 0.88 05/20/2009   CALCIUM 9.1 01/12/2019   CALCIUM 8.4 05/20/2009   LFT Recent Labs     01/12/19 1022  PROT 8.2*  ALBUMIN 3.5  AST 125*  ALT 44  ALKPHOS 125  BILITOT 0.7   PT/INR No results found for: INR, PROTIME Hepatitis Panel No results for input(s): HEPBSAG, HCVAB, HEPAIGM, HEPBIGM in the last 72 hours. C-Diff No components found for: CDIFF Lipase  No results found for: LIPASE  Drugs of Abuse     Component Value Date/Time   LABOPIA NONE DETECTED 01/12/2019 1415   COCAINSCRNUR NONE DETECTED 01/12/2019 1415   LABBENZ NONE DETECTED 01/12/2019 1415   AMPHETMU NONE DETECTED 01/12/2019 1415   THCU POSITIVE (A) 01/12/2019 1415   LABBARB NONE DETECTED 01/12/2019 1415     RADIOLOGY STUDIES: Ct Head Wo Contrast  Result Date: 01/12/2019 CLINICAL DATA:  58 year old male with seizure witnessed by bystander. Altered mental status. EXAM: CT HEAD WITHOUT CONTRAST TECHNIQUE: Contiguous axial images were obtained from the base of the skull through the vertex without intravenous contrast. COMPARISON:  Head [facility] FINDINGS: Brain: Mild cerebral volume loss since 2008 appears generalized. No midline shift, ventriculomegaly, mass effect, evidence of mass lesion, intracranial hemorrhage or evidence of cortically based acute infarction. Gray-white matter differentiation is within normal limits throughout the brain. Vascular: Minimal Calcified atherosclerosis at the skull base. No suspicious intracranial vascular hyperdensity. Skull: No acute osseous abnormality identified. Sinuses/Orbits: Mild right maxillary sinus mucosal thickening, otherwise  clear. Other: Chronic left posterior scalp lipoma. No acute orbit or scalp soft tissue findings. IMPRESSION: No acute intracranial abnormality. Negative for age non contrast CT appearance of the brain. Electronically Signed   By: Genevie Ann M.D.   On: 01/12/2019 12:06     IMPRESSION:   *    Iron deficiency anemia.  1 of 1 FOBT tests negative.  Reports of intermittent black stool and scant hematemesis along  with pyrosis, weight loss. Rule out peptic ulcer disease, rule out neoplasia.  *    Seizure disorder.  This was not in the setting of alcohol abstinence.  Prior history of seizures but never medicated for this.  Head CT unremarkable.  *    Leukopenia.  *     COVID-19 negative  *    Alcohol abuse.  Alcohol level not elevated at arrival.  Tox screen positive for THC.  *    Elevated AST, transaminase pattern consistent with alcohol abuse, possible mild alcoholic hepatitis.    PLAN:     *    EGD, colonoscopy.  This for evaluation of his upper GI symptoms, anemia and screening for colon polyps in 58 year old. Risks, benefits, study details discussed with patient and he is agreeable to proceed. ?  Can we do this tomorrow. Continue clear liquid diet and determine if we can proceed with studies tomorrow in which case he will undergo bowel prep PT/INR/PTT collected, results pending. Decrease frequency of blood draws as he currently has hemoglobins drawn every 6 hours    Azucena Freed  01/13/2019, 9:28 AM Phone [phone]   Attending physician's note   I have taken a history, examined the patient and reviewed the chart. I agree with the Advanced Practitioner's note, impression and recommendations.  58 year old male with history of chronic alcohol abuse admitted after an epsiode of seizures, and has severe iron deficiency anemia  No evidence of cirrhosis based on exam and labs  Elevated AST secondary to ETOH. Normal Bilirubin Check PT and INR  COVID 19 negative  We will plan for EGD and colonoscopy  for further evaluation of severe iron deficiency anemia.  We will tentatively plan procedures for tomorrow if no further seizures or has active alcohol withdrawal Monitor for alcohol withdrawal and supportive care per primary team  Clear liquids and start bowel prep this PM  The risks and benefits as well as alternatives of endoscopic procedure(s) have been discussed and reviewed.  All questions answered. The patient agrees to proceed.   Damaris Hippo , MD [phone]

## 2019-01-14 ENCOUNTER — Inpatient Hospital Stay (HOSPITAL_COMMUNITY): Payer: Self-pay | Admitting: Anesthesiology

## 2019-01-14 ENCOUNTER — Inpatient Hospital Stay (HOSPITAL_COMMUNITY): Payer: Self-pay

## 2019-01-14 ENCOUNTER — Encounter (HOSPITAL_COMMUNITY)
Admission: EM | Disposition: A | Discharge status: Home / Self Care | Payer: Self-pay | Source: Home / Self Care | Attending: Internal Medicine

## 2019-01-14 ENCOUNTER — Encounter (HOSPITAL_COMMUNITY): Payer: Self-pay

## 2019-01-14 DIAGNOSIS — K2970 Gastritis, unspecified, without bleeding: Secondary | ICD-10-CM

## 2019-01-14 DIAGNOSIS — K2100 Gastro-esophageal reflux disease with esophagitis, without bleeding: Secondary | ICD-10-CM

## 2019-01-14 HISTORY — PX: COLONOSCOPY WITH PROPOFOL: SHX5780

## 2019-01-14 HISTORY — PX: BIOPSY: SHX5522

## 2019-01-14 HISTORY — PX: ESOPHAGOGASTRODUODENOSCOPY: SHX5428

## 2019-01-14 LAB — CBC
HCT: 26.2 % — ABNORMAL LOW (ref 39.0–52.0)
Hemoglobin: 7.9 g/dL — ABNORMAL LOW (ref 13.0–17.0)
MCH: 22.6 pg — ABNORMAL LOW (ref 26.0–34.0)
MCHC: 30.2 g/dL (ref 30.0–36.0)
MCV: 74.9 fL — ABNORMAL LOW (ref 80.0–100.0)
Platelets: 222 10*3/uL (ref 150–400)
RBC: 3.5 MIL/uL — ABNORMAL LOW (ref 4.22–5.81)
RDW: 21.3 % — ABNORMAL HIGH (ref 11.5–15.5)
WBC: 5.7 10*3/uL (ref 4.0–10.5)
nRBC: 0 % (ref 0.0–0.2)

## 2019-01-14 LAB — COMPREHENSIVE METABOLIC PANEL
ALT: 43 U/L (ref 0–44)
AST: 148 U/L — ABNORMAL HIGH (ref 15–41)
Albumin: 3.1 g/dL — ABNORMAL LOW (ref 3.5–5.0)
Alkaline Phosphatase: 91 U/L (ref 38–126)
Anion gap: 11 (ref 5–15)
BUN: <5 mg/dL — ABNORMAL LOW (ref 6–20)
CO2: 22 mmol/L (ref 22–32)
Calcium: 8.9 mg/dL (ref 8.9–10.3)
Chloride: 103 mmol/L (ref 98–111)
Creatinine, Ser: 0.77 mg/dL (ref 0.61–1.24)
GFR calc Af Amer: >60 mL/min (ref >60)
GFR calc non Af Amer: >60 mL/min (ref >60)
Glucose, Bld: 100 mg/dL — ABNORMAL HIGH (ref 70–99)
Potassium: 3.1 mmol/L — ABNORMAL LOW (ref 3.5–5.1)
Sodium: 136 mmol/L (ref 135–145)
Total Bilirubin: 0.9 mg/dL (ref 0.3–1.2)
Total Protein: 7.3 g/dL (ref 6.5–8.1)

## 2019-01-14 LAB — MAGNESIUM: Magnesium: 1.4 mg/dL — ABNORMAL LOW (ref 1.7–2.4)

## 2019-01-14 SURGERY — ESOPHAGOGASTRODUODENOSCOPY (EGD) WITH PROPOFOL
Anesthesia: Monitor Anesthesia Care

## 2019-01-14 SURGERY — EGD (ESOPHAGOGASTRODUODENOSCOPY)
Anesthesia: Monitor Anesthesia Care

## 2019-01-14 MED ORDER — PANTOPRAZOLE SODIUM 40 MG PO TBEC: 40.0000 mg | DELAYED_RELEASE_TABLET | Freq: Two times a day (BID) | ORAL | 1 refills | Status: DC

## 2019-01-14 MED ORDER — PROPOFOL 500 MG/50ML IV EMUL
INTRAVENOUS | Status: DC | PRN
  Administered 2019-01-14: 50 ug/kg/min via INTRAVENOUS

## 2019-01-14 MED ORDER — IOHEXOL 300 MG/ML  SOLN
100.0000 mL | Freq: Once | INTRAMUSCULAR | Status: AC | PRN
  Administered 2019-01-14: 100 mL via INTRAVENOUS

## 2019-01-14 MED ORDER — SODIUM CHLORIDE 0.9 % IV SOLN
INTRAVENOUS | Status: DC
  Administered 2019-01-14 (×2): via INTRAVENOUS

## 2019-01-14 MED ORDER — FOLIC ACID 1 MG PO TABS: 1.0000 mg | ORAL_TABLET | Freq: Every day | ORAL | 1 refills | Status: DC

## 2019-01-14 MED ORDER — THIAMINE HCL 100 MG PO TABS: 100.0000 mg | ORAL_TABLET | Freq: Every day | ORAL | 1 refills | Status: DC

## 2019-01-14 MED ORDER — LEVETIRACETAM 500 MG PO TABS: 500.0000 mg | ORAL_TABLET | Freq: Two times a day (BID) | ORAL | 1 refills | Status: DC

## 2019-01-14 MED ORDER — MAGNESIUM SULFATE 2 GM/50ML IV SOLN
2.0000 g | Freq: Once | INTRAVENOUS | Status: AC
  Administered 2019-01-14: 2 g via INTRAVENOUS
  Filled 2019-01-14: qty 50

## 2019-01-14 MED ORDER — POTASSIUM CHLORIDE 10 MEQ/100ML IV SOLN
10.0000 meq | INTRAVENOUS | Status: AC
  Administered 2019-01-14 (×4): 10 meq via INTRAVENOUS
  Filled 2019-01-14 (×4): qty 100

## 2019-01-14 MED ORDER — ADULT MULTIVITAMIN W/MINERALS CH: 1.0000 | ORAL_TABLET | Freq: Every day | ORAL | 1 refills | Status: DC

## 2019-01-14 MED ORDER — DEXMEDETOMIDINE HCL IN NACL 200 MCG/50ML IV SOLN
INTRAVENOUS | Status: DC | PRN
  Administered 2019-01-14 (×3): 12 ug via INTRAVENOUS

## 2019-01-14 MED ORDER — PROPOFOL 10 MG/ML IV BOLUS
INTRAVENOUS | Status: DC | PRN
  Administered 2019-01-14: 70 mg via INTRAVENOUS

## 2019-01-14 MED ORDER — AMLODIPINE BESYLATE 5 MG PO TABS: 5.0000 mg | ORAL_TABLET | Freq: Every day | ORAL | 1 refills | Status: DC

## 2019-01-14 SURGICAL SUPPLY — 22 items

## 2019-01-14 NOTE — Plan of Care (Signed)
  Problem: Education: Goal: Knowledge of General Education information will improve Description Including pain rating scale, medication(s)/side effects and non-pharmacologic comfort measures Outcome: Progressing   

## 2019-01-14 NOTE — Progress Notes (Signed)
Pt back on the floor from ENDO- pt denies any pain and is axox4. No distress noted, call light within reach, bed alarm on and bed in lowest position.   Paulla Fore, RN, BSN

## 2019-01-14 NOTE — Progress Notes (Signed)
PROGRESS NOTE    Scott Choi  HMC:[id number] DOB: 1960-05-15 DOA: 01/12/2019 PCP: Patient, No Pcp Per   Brief Narrative:  Scott Choi a58 y.o.male,with past medical history significant for alcoholism, brought to our facility by EMS for witnessed seizure by a bystander. Patient reports his last drink was yesterday (10/7). EMS reported postictal state.  In the emergency room mental status continued to deteriorate and he was found to be anemic. Patient has history of taking nonsteroidals. His stool guaiac was negative but his hemoglobin was 7. Patient reports dark tarry stools and occasional coffee ground emesis with occasional bright red blood.  Admitted for blood transfusion and monitoring with CIWA protocol.  Received one unit packed red blood cells.  Trending hemoglobin.  GI and neurology consulted for further evaluations of his seizure and apparent GI bleeding.    Patient reports seizures in the past, one a couple months ago and at least one other in the distant past.  He had only been without alcohol for 24 hours when the seizure occurred.  He does not recall whether prior seizures were related to alcohol withdrawal but thinks they were not.  Patient expresses some interest in stopping drinking.  States he can stop if he really wants to, but would be willing to consider counseling or other forms of help with this.   Assessment & Plan:   Principal Problem:   Iron deficiency anemia due to chronic blood loss Active Problems:   Seizure (HCC)   Alcohol dependence with uncomplicated withdrawal (HCC)   Iron deficiency anemia:  Gastritis: Apparent chronic blood loss secondary to NSAID use as well as excessive daily alcohol intake, however need to consider GI malignancy (no prior colonoscopy).  Hemoglobin was 7 on presentation, received 1 unit packed red cells.  EGD showed esophagitis and gastritis and small hiatal hernia, but no active bleeding.  Colonoscopy remarkable only for  internal hemorrhoids.  GI requested CT abdomen/pelvis to rule out other intra-abdominal pathology. -Trend H&H - Protonix 40 mg PO daily - avoid all NSAID's, aspirin, and recommend alcohol cessation  Seizure: Etiology to be determined, consider chronic seizure disorder versus possible alcohol withdrawal induced, however last drink was less than 24 hours prior to the seizure.  Witnessed generalized tonic-clonic seizure on street corner, brought in by EMS in postictal state.  Now back to baseline mentation.  Discussed case with neurologist, Dr. Rory Percy.  Can start antiepileptic therapy while inpatient and follow-up with neurology as outpatient since he is at baseline.  No need for EEG while inpatient. - Load Keppra 1 g IV today - Start oral Keppra 500 twice daily tomorrow - Follow-up with neurology as outpatient  Alcohol dependence: Patient reported drinking 80 ounces malt liquor and up to a pint of gin daily.  Last drink was approximately 24 hours prior to presentation.  He denies history of DTs.   - CIWA protocol with as needed Ativan -Started on thiamine, folate and multivitamin - Counseled patient on alcohol cessation   DVT prophylaxis: SCD's Code Status:   Code Status: Full Code  Family Communication: none at bedside Disposition Plan: d/c home Sunday   Consultants:   Gastroenterology  Procedures:   EGD and colonoscopy 10/10 EGD impression: LA Grade B reflux esophagitis, 2 cm hiatal hernia, gastritis. Biopsies taken. Colonoscopy impression:  Moderate internal hemorrhoids, otherwise normal colonoscopy  Antimicrobials:  None     Subjective: Patient awake laying in bed.  Denies any further blood in stool.  No nausea or vomiting.  Denies tremors, diaphoresis,  hallucinations, or other alcohol withdrawal symptoms at this time.  States he is hungry (awaiting scopes with GI today).  Objective: Vitals:   01/14/19 1112 01/14/19 1122 01/14/19 1132 01/14/19 1635  BP: 95/69 92/64  111/78 (!) 154/90  Pulse: (!) 114 (!) 106 98 68  Resp: 15 18 17 18   Temp: 98 F (36.7 C)   98.8 F (37.1 C)  TempSrc: Axillary   Oral  SpO2: 100% 98% 100% 100%  Weight:      Height:        Intake/Output Summary (Last 24 hours) at 01/14/2019 1918 Last data filed at 01/14/2019 1800 Gross per 24 hour  Intake 1825 ml  Output 1275 ml  Net 550 ml   Filed Weights   01/12/19 0959 01/14/19 0500  Weight: 68 kg 58.5 kg    Examination:  General exam: Appears calm and comfortable, frail, underweight Respiratory system: Clear to auscultation. Respiratory effort normal. Cardiovascular system: S1 & S2 heard, RRR. No JVD, murmurs, rubs, gallops or clicks. No pedal edema. Gastrointestinal system: Abdomen is nondistended, soft and nontender.  Normal bowel sounds heard. Central nervous system: Alert and oriented. No focal neurological deficits. Extremities: Symmetric 5 x 5 power, no edema. Skin: dry, intact, normal temperature Psychiatry: Mood & affect appropriate.      Data Reviewed: I have personally reviewed following labs and imaging studies  CBC: Recent Labs  Lab 01/12/19 1022 01/12/19 1520 01/12/19 1747 01/12/19 2347 01/13/19 0608 01/13/19 1740 01/14/19 0413  WBC 3.0*  --   --   --   --   --  5.7  NEUTROABS 2.1  --   --   --   --   --   --   HGB 7.0* 8.5* 6.6* 7.8* 7.5* 8.2* 7.9*  HCT 25.0* 25.0*  --   --   --   --  26.2*  MCV 74.4*  --   --   --   --   --  74.9*  PLT 243  --   --   --   --   --  892   Basic Metabolic Panel: Recent Labs  Lab 01/12/19 1022 01/12/19 1500 01/12/19 1520 01/14/19 0413  NA 134*  --  135 136  K 3.5  --  3.5 3.1*  CL 98  --   --  103  CO2 17*  --   --  22  GLUCOSE 98  --   --  100*  BUN <5*  --   --  <5*  CREATININE 0.90  --   --  0.77  CALCIUM 9.1  --   --  8.9  MG  --  1.5*  --  1.4*  PHOS  --  3.6  --   --    GFR: Estimated Creatinine Clearance: 85.3 mL/min (by C-G formula based on SCr of 0.77 mg/dL). Liver Function Tests:  Recent Labs  Lab 01/12/19 1022 01/14/19 0413  AST 125* 148*  ALT 44 43  ALKPHOS 125 91  BILITOT 0.7 0.9  PROT 8.2* 7.3  ALBUMIN 3.5 3.1*   No results for input(s): LIPASE, AMYLASE in the last 168 hours. No results for input(s): AMMONIA in the last 168 hours. Coagulation Profile: Recent Labs  Lab 01/13/19 0935  INR 1.1   Cardiac Enzymes: No results for input(s): CKTOTAL, CKMB, CKMBINDEX, TROPONINI in the last 168 hours. BNP (last 3 results) No results for input(s): PROBNP in the last 8760 hours. HbA1C: No results for input(s): HGBA1C in the last  72 hours. CBG: No results for input(s): GLUCAP in the last 168 hours. Lipid Profile: No results for input(s): CHOL, HDL, LDLCALC, TRIG, CHOLHDL, LDLDIRECT in the last 72 hours. Thyroid Function Tests: No results for input(s): TSH, T4TOTAL, FREET4, T3FREE, THYROIDAB in the last 72 hours. Anemia Panel: Recent Labs    01/12/19 1459  VITAMINB12 158*  FOLATE 15.2  FERRITIN 3*  TIBC 515*  IRON 25*  RETICCTPCT 1.7   Sepsis Labs: No results for input(s): PROCALCITON, LATICACIDVEN in the last 168 hours.  Recent Results (from the past 240 hour(s))  SARS Coronavirus 2 by RT PCR (hospital order, performed in [facility] hospital lab) Nasopharyngeal Nasopharyngeal Swab     Status: None   Collection Time: 01/12/19  2:15 PM   Specimen: Nasopharyngeal Swab  Result Value Ref Range Status   SARS Coronavirus 2 NEGATIVE NEGATIVE Final    Comment: (NOTE) If result is NEGATIVE SARS-CoV-2 target nucleic acids are NOT DETECTED. The SARS-CoV-2 RNA is generally detectable in upper and lower  respiratory specimens during the acute phase of infection. The lowest  concentration of SARS-CoV-2 viral copies this assay can detect is 250  copies / mL. A negative result does not preclude SARS-CoV-2 infection  and should not be used as the sole basis for treatment or other  patient management decisions.  A negative result may occur with  improper  specimen collection / handling, submission of specimen other  than nasopharyngeal swab, presence of viral mutation(s) within the  areas targeted by this assay, and inadequate number of viral copies  (<250 copies / mL). A negative result must be combined with clinical  observations, patient history, and epidemiological information. If result is POSITIVE SARS-CoV-2 target nucleic acids are DETECTED. The SARS-CoV-2 RNA is generally detectable in upper and lower  respiratory specimens dur ing the acute phase of infection.  Positive  results are indicative of active infection with SARS-CoV-2.  Clinical  correlation with patient history and other diagnostic information is  necessary to determine patient infection status.  Positive results do  not rule out bacterial infection or co-infection with other viruses. If result is PRESUMPTIVE POSTIVE SARS-CoV-2 nucleic acids MAY BE PRESENT.   A presumptive positive result was obtained on the submitted specimen  and confirmed on repeat testing.  While 2019 novel coronavirus  (SARS-CoV-2) nucleic acids may be present in the submitted sample  additional confirmatory testing may be necessary for epidemiological  and / or clinical management purposes  to differentiate between  SARS-CoV-2 and other Sarbecovirus currently known to infect humans.  If clinically indicated additional testing with an alternate test  methodology [id number]) is advised. The SARS-CoV-2 RNA is generally  detectable in upper and lower respiratory sp ecimens during the acute  phase of infection. The expected result is Negative. Fact Sheet for Patients:  StrictlyIdeas.no Fact Sheet for Healthcare Providers: BankingDealers.co.za This test is not yet approved or cleared by the [place] FDA and has been authorized for detection and/or diagnosis of SARS-CoV-2 by FDA under an Emergency Use Authorization (EUA).  This EUA will remain in  effect (meaning this test can be used) for the duration of the COVID-19 declaration under Section 564(b)(1) of the Act, 21 U.S.C. section 360bbb-3(b)(1), unless the authorization is terminated or revoked sooner. Performed at [facility], [place] [address], [place], [place] [address]          Radiology Studies: No results found.      Scheduled Meds: . sodium chloride   Intravenous  Once  . amLODipine  5 mg Oral Daily  . feeding supplement  1 Container Oral TID BM  . folic acid  1 mg Oral Daily  . levETIRAcetam  500 mg Oral BID  . multivitamin with minerals  1 tablet Oral Daily  . pantoprazole  40 mg Oral BID  . peg 3350 powder  0.5 kit Oral Once  . sodium chloride flush  3 mL Intravenous Q12H  . thiamine  100 mg Oral Daily   Or  . thiamine  100 mg Intravenous Daily   Continuous Infusions: . sodium chloride    . sodium chloride 75 mL/hr at 01/14/19 0827     LOS: 2 days    Time spent: 40-45 min    Ezekiel Slocumb, MD [facility] Pager [phone]  If 7PM-7AM, please contact night-coverage [email or web address] Password [email or web address] 01/14/2019, 7:18 PM

## 2019-01-14 NOTE — Op Note (Addendum)
[facility] Patient Name: Scott Choi Procedure Date : 01/14/2019 MRN: [id number] Attending MD: Scott Bologna , MD Date of Birth: 08/11/1960 CSN: [id number] Age: 58 Admit Type: Inpatient Procedure:                Upper GI endoscopy Indications:              Iron deficiency anemia secondary to chronic blood                            loss Providers:                Scott Bologna, MD, Vicki Mallet, RN, Lawson Radar,                            Technician, Noel Christmas, CRNA Referring MD:              Medicines:                Monitored Anesthesia Care Complications:            No immediate complications. Estimated Blood Loss:     Estimated blood loss: none. Procedure:                Pre-Anesthesia Assessment:                           - Prior to the procedure, a History and Physical                            was performed, and patient medications and                            allergies were reviewed. The patient's tolerance of                            previous anesthesia was also reviewed. The risks                            and benefits of the procedure and the sedation                            options and risks were discussed with the patient.                            All questions were answered, and informed consent                            was obtained. Prior Anticoagulants: The patient has                            taken no previous anticoagulant or antiplatelet                            agents. ASA Grade Assessment: II - A patient with  mild systemic disease. After reviewing the risks                            and benefits, the patient was deemed in                            satisfactory condition to undergo the procedure.                           - Prior to the procedure, a History and Physical                            was performed, and patient medications and                            allergies were reviewed. The  patient's tolerance of                            previous anesthesia was also reviewed. The risks                            and benefits of the procedure and the sedation                            options and risks were discussed with the patient.                            All questions were answered, and informed consent                            was obtained. Prior Anticoagulants: The patient has                            taken no previous anticoagulant or antiplatelet                            agents. ASA Grade Assessment: II - A patient with                            mild systemic disease. After reviewing the risks                            and benefits, the patient was deemed in                            satisfactory condition to undergo the procedure.                           After obtaining informed consent, the endoscope was                            passed under direct vision. Throughout the  procedure, the patient's blood pressure, pulse, and                            oxygen saturations were monitored continuously. The                            [id number]-H190 ([id number]) Olympus gastroscope was                            introduced through the mouth, and advanced to the                            second part of duodenum. The upper GI endoscopy was                            accomplished without difficulty. The patient                            tolerated the procedure well. Scope In: Scope Out: Findings:      LA Grade B (one or more mucosal breaks greater than 5 mm, not extending       between the tops of two mucosal folds) esophagitis with no bleeding was       found 38 cm from the incisors at the [employer]. Biopsies were taken       with a cold forceps for histology. Lower end of the esophagus was also       examined by narrowband imaging. No esophageal varices.      A 2 cm hiatal hernia was present.      Localized mild inflammation  characterized by erythema was found in the       gastric antrum. Biopsies were taken with a cold forceps for histology.      The examined duodenum was normal. Biopsies for histology were taken with       a cold forceps for evaluation of celiac disease. Impression:               - LA Grade B reflux esophagitis. Biopsied.                           - 2 cm hiatal hernia.                           - Gastritis. Biopsied. Recommendation:           - Return patient to hospital ward for ongoing care.                           - CT abdo/pel today                           - Resume previous diet.                           - Use Protonix (pantoprazole) 40 mg PO daily.                           - No aspirin, ibuprofen,  naproxen, or other                            non-steroidal anti-inflammatory drugs.                           - Await pathology results.                           - Return to GI clinic in 12 weeks. Procedure Code(s):        --- Professional ---                           [phone] [id number], Esophagogastroduodenoscopy, flexible,                            transoral; with biopsy, single or multiple Diagnosis Code(s):        --- Professional ---                           K21.0, Gastro-esophageal reflux disease with                            esophagitis                           K44.9, Diaphragmatic hernia without obstruction or                            gangrene                           K29.70, Gastritis, unspecified, without bleeding                           D50.0, Iron deficiency anemia secondary to blood                            loss (chronic) CPT copyright 2019 American Medical Association. All rights reserved. The codes documented in this report are preliminary and upon coder review may  be revised to meet current compliance requirements. Scott Bolognaajesh Shareka Casale, MD 01/14/2019 11:27:34 AM This report has been signed electronically. Number of Addenda: 0

## 2019-01-14 NOTE — Op Note (Signed)
[facility] Patient Name: Scott Choi Procedure Date : 01/14/2019 MRN: [id number] Attending MD: Lynann Bologna , MD Date of Birth: 1961-01-14 CSN: [id number] Age: 58 Admit Type: Inpatient Procedure:                Colonoscopy Indications:              Iron deficiency anemia Providers:                Lynann Bologna, MD, Vicki Mallet, RN, Lawson Radar,                            Technician, Noel Christmas, CRNA Referring MD:              Medicines:                Monitored Anesthesia Care Complications:            No immediate complications. Estimated Blood Loss:     Estimated blood loss: none. Procedure:                Pre-Anesthesia Assessment:                           - Prior to the procedure, a History and Physical                            was performed, and patient medications and                            allergies were reviewed. The patient's tolerance of                            previous anesthesia was also reviewed. The risks                            and benefits of the procedure and the sedation                            options and risks were discussed with the patient.                            All questions were answered, and informed consent                            was obtained. Prior Anticoagulants: The patient has                            taken no previous anticoagulant or antiplatelet                            agents. ASA Grade Assessment: II - A patient with                            mild systemic disease. After reviewing the risks  and benefits, the patient was deemed in                            satisfactory condition to undergo the procedure.                           After obtaining informed consent, the colonoscope                            was passed under direct vision. Throughout the                            procedure, the patient's blood pressure, pulse, and                            oxygen  saturations were monitored continuously. The                            PCF-H190DL ([id number]) Olympus pediatric colonoscope                            was introduced through the anus and advanced to the                            the terminal ileum. The colonoscopy was performed                            without difficulty. The patient tolerated the                            procedure well. The quality of the bowel                            preparation was good. The terminal ileum, ileocecal                            valve, appendiceal orifice, and rectum were                            photographed. Scope In: 10:54:59 AM Scope Out: 11:03:39 AM Scope Withdrawal Time: 0 hours 5 minutes 23 seconds  Total Procedure Duration: 0 hours 8 minutes 40 seconds  Findings:      The colon (entire examined portion) appeared normal.      Non-bleeding internal hemorrhoids were found during retroflexion and       during perianal exam. The hemorrhoids were moderate Grade III (internal       hemorrhoids that prolapse but require manual reduction).      The terminal ileum appeared normal.      The exam was otherwise without abnormality. Impression:               -Moderate internal hemorrhoids.                           -Otherwise normal colonoscopy to TI.\ Recommendation:           -  Return patient to hospital ward for ongoing care.                           - Use HC Cream 2.5%: Apply externally BID for 2                            weeks.                           - Return to GI clinic in 12 weeks. Procedure Code(s):        --- Professional ---                           [id number], 53, Colonoscopy, flexible; diagnostic,                            including collection of specimen(s) by brushing or                            washing, when performed (separate procedure) Diagnosis Code(s):        --- Professional ---                           K64.2, Third degree hemorrhoids                           D50.9,  Iron deficiency anemia, unspecified CPT copyright 2019 American Medical Association. All rights reserved. The codes documented in this report are preliminary and upon coder review may  be revised to meet current compliance requirements. Lynann Bolognaajesh Juline Sanderford, MD 01/14/2019 11:13:14 AM This report has been signed electronically. Number of Addenda: 0

## 2019-01-14 NOTE — Anesthesia Preprocedure Evaluation (Addendum)
Anesthesia Evaluation  Patient identified by MRN, date of birth, ID band Patient awake    Reviewed: Allergy & Precautions, NPO status , Patient's Chart, lab work & pertinent test results  Airway Mallampati: II  TM Distance: >3 FB Neck ROM: Full    Dental  (+) Dental Advisory Given, Poor Dentition, Missing, Chipped   Pulmonary neg pulmonary ROS, former smoker,    Pulmonary exam normal breath sounds clear to auscultation       Cardiovascular hypertension, Normal cardiovascular exam Rhythm:Regular Rate:Normal     Neuro/Psych Seizures -,  negative neurological ROS     GI/Hepatic negative GI ROS, (+)     substance abuse  alcohol use,   Endo/Other  negative endocrine ROS  Renal/GU negative Renal ROS     Musculoskeletal negative musculoskeletal ROS (+)   Abdominal   Peds  Hematology  (+) Blood dyscrasia, anemia ,   Anesthesia Other Findings Day of surgery medications reviewed with the patient.  Reproductive/Obstetrics                           Anesthesia Physical Anesthesia Plan  ASA: II  Anesthesia Plan: MAC   Post-op Pain Management:    Induction: Intravenous  PONV Risk Score and Plan: 1 and Propofol infusion and Treatment may vary due to age or medical condition  Airway Management Planned: Nasal Cannula and Natural Airway  Additional Equipment:   Intra-op Plan:   Post-operative Plan:   Informed Consent: I have reviewed the patients History and Physical, chart, labs and discussed the procedure including the risks, benefits and alternatives for the proposed anesthesia with the patient or authorized representative who has indicated his/her understanding and acceptance.     Dental advisory given  Plan Discussed with: CRNA and Anesthesiologist  Anesthesia Plan Comments:         Anesthesia Quick Evaluation

## 2019-01-14 NOTE — Anesthesia Postprocedure Evaluation (Signed)
Anesthesia Post Note  Patient: Scott Choi  Procedure(s) Performed: ESOPHAGOGASTRODUODENOSCOPY (EGD) (N/A ) COLONOSCOPY WITH PROPOFOL (N/A ) BIOPSY     Patient location during evaluation: Endoscopy Anesthesia Type: MAC Level of consciousness: awake and alert Pain management: pain level controlled Vital Signs Assessment: post-procedure vital signs reviewed and stable Respiratory status: spontaneous breathing, nonlabored ventilation and respiratory function stable Cardiovascular status: stable and blood pressure returned to baseline Postop Assessment: no apparent nausea or vomiting Anesthetic complications: no    Last Vitals:  Vitals:   01/14/19 1132 01/14/19 1635  BP: 111/78 (!) 154/90  Pulse: 98 68  Resp: 17 18  Temp:  37.1 C  SpO2: 100% 100%    Last Pain:  Vitals:   01/14/19 1635  TempSrc: Oral  PainSc:                  Catalina Gravel

## 2019-01-14 NOTE — Interval H&P Note (Signed)
History and Physical Interval Note:  01/14/2019 10:08 AM  Scott Choi  has presented today for surgery, with the diagnosis of Anemia.  The various methods of treatment have been discussed with the patient and family. After consideration of risks, benefits and other options for treatment, the patient has consented to  Procedure(s): ESOPHAGOGASTRODUODENOSCOPY (EGD) (N/A) COLONOSCOPY WITH PROPOFOL (N/A) as a surgical intervention.  The patient's history has been reviewed, patient examined, no change in status, stable for surgery.  I have reviewed the patient's chart and labs.  Questions were answered to the patient's satisfaction.     Jackquline Denmark

## 2019-01-14 NOTE — Anesthesia Procedure Notes (Signed)
Procedure Name: MAC Date/Time: 01/14/2019 10:17 AM Performed by: Alain Marion, CRNA Pre-anesthesia Checklist: Patient identified, Emergency Drugs available, Suction available, Patient being monitored and Timeout performed Oxygen Delivery Method: Nasal cannula Placement Confirmation: positive ETCO2

## 2019-01-14 NOTE — Transfer of Care (Signed)
Immediate Anesthesia Transfer of Care Note  Patient: Scott Choi  Procedure(s) Performed: ESOPHAGOGASTRODUODENOSCOPY (EGD) (N/A ) COLONOSCOPY WITH PROPOFOL (N/A ) BIOPSY  Patient Location: Endoscopy Unit  Anesthesia Type:MAC  Level of Consciousness: drowsy  Airway & Oxygen Therapy: Patient Spontanous Breathing  Post-op Assessment: Report given to RN  Post vital signs: Reviewed and stable  Last Vitals:  Vitals Value Taken Time  BP    Temp    Pulse    Resp    SpO2      Last Pain:  Vitals:   01/14/19 0952  TempSrc: Oral  PainSc: 0-No pain      Patients Stated Pain Goal: 0 (73/71/06 2694)  Complications: No apparent anesthesia complications

## 2019-01-14 NOTE — Plan of Care (Signed)
  Problem: Activity: Goal: Risk for activity intolerance will decrease Outcome: Progressing   Problem: Safety: Goal: Ability to remain free from injury will improve Outcome: Progressing   

## 2019-01-15 ENCOUNTER — Encounter (HOSPITAL_COMMUNITY): Payer: Self-pay | Admitting: Gastroenterology

## 2019-01-15 DIAGNOSIS — K449 Diaphragmatic hernia without obstruction or gangrene: Secondary | ICD-10-CM

## 2019-01-15 DIAGNOSIS — K209 Esophagitis, unspecified without bleeding: Secondary | ICD-10-CM | POA: Clinically undetermined

## 2019-01-15 DIAGNOSIS — I10 Essential (primary) hypertension: Secondary | ICD-10-CM | POA: Clinically undetermined

## 2019-01-15 DIAGNOSIS — K2970 Gastritis, unspecified, without bleeding: Secondary | ICD-10-CM | POA: Clinically undetermined

## 2019-01-15 LAB — CBC WITH DIFFERENTIAL/PLATELET
Abs Immature Granulocytes: 0.03 10*3/uL (ref 0.00–0.07)
Basophils Absolute: 0.1 10*3/uL (ref 0.0–0.1)
Basophils Relative: 1 %
Eosinophils Absolute: 0.1 10*3/uL (ref 0.0–0.5)
Eosinophils Relative: 2 %
HCT: 25.4 % — ABNORMAL LOW (ref 39.0–52.0)
Hemoglobin: 7.4 g/dL — ABNORMAL LOW (ref 13.0–17.0)
Immature Granulocytes: 0 %
Lymphocytes Relative: 12 %
Lymphs Abs: 1 10*3/uL (ref 0.7–4.0)
MCH: 22.4 pg — ABNORMAL LOW (ref 26.0–34.0)
MCHC: 29.1 g/dL — ABNORMAL LOW (ref 30.0–36.0)
MCV: 77 fL — ABNORMAL LOW (ref 80.0–100.0)
Monocytes Absolute: 1.3 10*3/uL — ABNORMAL HIGH (ref 0.1–1.0)
Monocytes Relative: 17 %
Neutro Abs: 5.4 10*3/uL (ref 1.7–7.7)
Neutrophils Relative %: 68 %
Platelets: 238 10*3/uL (ref 150–400)
RBC: 3.3 MIL/uL — ABNORMAL LOW (ref 4.22–5.81)
RDW: 21.5 % — ABNORMAL HIGH (ref 11.5–15.5)
WBC: 7.9 10*3/uL (ref 4.0–10.5)
nRBC: 0 % (ref 0.0–0.2)

## 2019-01-15 LAB — BASIC METABOLIC PANEL
Anion gap: 10 (ref 5–15)
BUN: <5 mg/dL — ABNORMAL LOW (ref 6–20)
CO2: 21 mmol/L — ABNORMAL LOW (ref 22–32)
Calcium: 8.6 mg/dL — ABNORMAL LOW (ref 8.9–10.3)
Chloride: 106 mmol/L (ref 98–111)
Creatinine, Ser: 0.82 mg/dL (ref 0.61–1.24)
GFR calc Af Amer: >60 mL/min (ref >60)
GFR calc non Af Amer: >60 mL/min (ref >60)
Glucose, Bld: 102 mg/dL — ABNORMAL HIGH (ref 70–99)
Potassium: 3.2 mmol/L — ABNORMAL LOW (ref 3.5–5.1)
Sodium: 137 mmol/L (ref 135–145)

## 2019-01-15 MED ORDER — POTASSIUM CHLORIDE CRYS ER 20 MEQ PO TBCR
40.0000 meq | EXTENDED_RELEASE_TABLET | ORAL | Status: AC
  Administered 2019-01-15 (×2): 40 meq via ORAL
  Filled 2019-01-15 (×2): qty 2

## 2019-01-15 MED ORDER — THIAMINE HCL 100 MG PO TABS: 100.0000 mg | ORAL_TABLET | Freq: Every day | ORAL | 1 refills | Status: DC

## 2019-01-15 MED ORDER — ADULT MULTIVITAMIN W/MINERALS CH: 1.0000 | ORAL_TABLET | Freq: Every day | ORAL | 1 refills | Status: AC

## 2019-01-15 MED ORDER — FOLIC ACID 1 MG PO TABS: 1.0000 mg | ORAL_TABLET | Freq: Every day | ORAL | 1 refills | Status: DC

## 2019-01-15 MED ORDER — AMLODIPINE BESYLATE 5 MG PO TABS: 5.0000 mg | ORAL_TABLET | Freq: Every day | ORAL | 1 refills | Status: DC

## 2019-01-15 MED ORDER — PANTOPRAZOLE SODIUM 40 MG PO TBEC: 40.0000 mg | DELAYED_RELEASE_TABLET | Freq: Two times a day (BID) | ORAL | 1 refills | Status: AC

## 2019-01-15 MED ORDER — LEVETIRACETAM 500 MG PO TABS: 500.0000 mg | ORAL_TABLET | Freq: Two times a day (BID) | ORAL | 1 refills | Status: DC

## 2019-01-15 NOTE — Progress Notes (Signed)
CSW met with the patient at bedside and provided medicaid application and disability applications. CSW explained that she had reached out to his financial counselor to see if any applications had been started. CSW answered the patients questions.   CSW is signing off for now due to the patient discharging home. Should any new needs arise, please re-consult.   Domenic Schwab, MSW, [place] Worker [facility]  [phone]

## 2019-01-15 NOTE — TOC Initial Note (Addendum)
Transition of Care [facility]) - Initial/Assessment Note    Patient Details  Name: Scott Choi MRN: [id number] Date of Birth: 08-03-1960  [facility]) CM/SW Contact:    Erenest Rasher, RN Phone Number:  [phone] 01/15/2019, 11:07 AM  Clinical Narrative:                 Spoke to pt and states he uses public transportation to get to his job and appts. States he does not have insurance or PCP. Will arrange follow up appt on 01/16/2019. Will [facility]. Pt states his funds are limited. Explained MATCH/Procare with $3 copay and once per year use.   Expected Discharge Plan: Home/Self Care Barriers to Discharge: No Barriers Identified   Patient Goals and CMS Choice        Expected Discharge Plan and Services Expected Discharge Plan: Home/Self Care   Discharge Planning Services: CM Consult, [place] Program, Medication Assistance, Follow-up appt scheduled   Living arrangements for the past 2 months: Apartment Expected Discharge Date: 01/15/19                                    Prior Living Arrangements/Services Living arrangements for the past 2 months: Apartment Lives with:: Self Patient language and need for interpreter reviewed:: Yes Do you feel safe going back to the place where you live?: Yes      Need for Family Participation in Patient Care: No (Comment) Care giver support system in place?: No (comment)   Criminal Activity/Legal Involvement Pertinent to Current Situation/Hospitalization: No - Comment as needed  Activities of Daily Living Home Assistive Devices/Equipment: None ADL Screening (condition at time of admission) Patient's cognitive ability adequate to safely complete daily activities?: No Is the patient deaf or have difficulty hearing?: No Does the patient have difficulty seeing, even when wearing glasses/contacts?: No Does the patient have difficulty concentrating, remembering, or making decisions?: No Patient able to  express need for assistance with ADLs?: Yes Does the patient have difficulty dressing or bathing?: No Independently performs ADLs?: Yes (appropriate for developmental age) Does the patient have difficulty walking or climbing stairs?: No Weakness of Legs: Both(balance off in morning) Weakness of Arms/Hands: None  Permission Sought/Granted Permission sought to share information with : Case Manager, PCP, Family Supports Permission granted to share information with : Yes, Verbal Permission Granted  Share Information with NAME: Margaretmary Dys  Permission granted to share info w AGENCY: [place], [facility]  Permission granted to share info w Relationship: sister  Permission granted to share info w Contact Information: [phone]  Emotional Assessment Appearance:: Appears older than stated age Attitude/Demeanor/Rapport: Engaged Affect (typically observed): Accepting Orientation: : Oriented to Self, Oriented to Place, Oriented to  Time, Oriented to Situation Alcohol / Substance Use: Tobacco Use Psych Involvement: No (comment)  Admission diagnosis:  Seizure [facility]) [R56.9] Patient Active Problem List   Diagnosis Date Noted  . Alcohol dependence with uncomplicated withdrawal ([place]) 01/13/2019  . Iron deficiency anemia due to chronic blood loss 01/13/2019  . Seizure ([place]) 01/13/2019   PCP:  Patient, No Pcp Per Pharmacy:   [id number] [place], [place] [address] Phone: [phone] Fax: [phone]     Social Determinants of Health (SDOH) Interventions    Readmission Risk Interventions No flowsheet data found.

## 2019-01-15 NOTE — Discharge Summary (Addendum)
Physician Discharge Summary  Scott Choi ZOX:[id number] DOB: 1961-02-07 DOA: 01/12/2019  PCP: Patient, No Pcp Per  Admit date: 01/12/2019 Discharge date: 01/15/2019  Admitted From: Home  Disposition: Home  Recommendations for Outpatient Follow-up:  1. Follow up with PCP in 1-2 weeks 2. Please obtain BMP/CBC in one week 3. Please follow up on the following pending results:  Home Health: No Equipment/Devices: No  Discharge Condition: Stable  CODE STATUS: Full Diet recommendation: Regular  Brief/Interim Summary: Scott Choi a58 y.o.male,with past medical history significant for alcoholism, brought to our facility by EMS for witnessed seizure by a bystander. Patient reports his last drink was yesterday(10/7). EMS reportedpostictal state.In the emergency room mental status continued to deteriorate and he was found to be anemic. Patient has history of taking nonsteroidals. His stool guaiac was negative but his hemoglobin was 7. Patient reporteddark tarrystoolsand occasional coffee ground emesis with occasional bright red blood. Admitted for blood transfusion and monitoring with CIWA protocol. Received one unit packed red blood cells. Trending hemoglobin. GI and neurology consulted for further evaluations of his seizure and apparent GI bleeding.   Patient reports seizures in the past, one a couple months ago and at least one other in the distant past. He had only been without alcohol for 24 hours when the seizure occurred. He does not recall whether prior seizures were related to alcohol withdrawal but thinks they were not. No further seizure activity occurred during admission.  Case discussed with neurologist, Dr. Wilford Corner, who recommended loading and then continuing Keppra, with outpatient neurology follow-up.  No EEG needed while inpatient given patient's full recovery to baseline and no further seizure activity.  Patient underwent EGD and colonoscopy, neither of  which identified an active source of blood loss.  EGD did show esophagitis and gastritis along with a small hiatal hernia.  Colonoscopy was remarkable only for internal hemorrhoids.  GI requested a CT of abdomen pelvis to rule out other pathology.  CT was obtained no acute or chronic abnormalities were identified with the exception of possible chronic pancreatitis.  He was started on Protonix, and advised to avoid all NSAIDs, aspirin, and alcohol.  Patient also started on amlodipine for hypertension noted during admission.  Patient counseled to establish with primary care for close follow-up. Social work assisted with arranging for that appointment.   Discharge Diagnoses:  Principal Problem:   Iron deficiency anemia due to chronic blood loss Active Problems:   Seizure (HCC)   Alcohol dependence with uncomplicated withdrawal (HCC)   Gastritis   Esophagitis   Hiatal hernia   Essential hypertension    Discharge Instructions  Discharge Instructions    Call MD for:   Complete by: As directed    Recurrent blood in stool.   Call MD for:  extreme fatigue   Complete by: As directed    Call MD for:  extreme fatigue   Complete by: As directed    Call MD for:  persistant dizziness or light-headedness   Complete by: As directed    Call MD for:  persistant dizziness or light-headedness   Complete by: As directed    Diet - low sodium heart healthy   Complete by: As directed    Discharge instructions   Complete by: As directed    You have seizure disorder and we started medication (Keppra/levetiracetam).  Please take as prescribed and see neurologist in clinic to follow up.  Blood loss - your EGD and colonoscopy did not show any source of bleeding. CT scan results will  be discussed with GI in clinic at follow up.  Please go to that appointment.    You have high blood pressure - start taking amlodipine once/day.   Discharge instructions   Complete by: As directed    Please take all  medications as prescribed. - Levetiracetam (aka Keppra) is to prevent seizures - Pantoprazole (aka Protonix) is to reduce stomach acid - the others are vitamins but important given heavy alcohol use - please stay off alcohol if possible, seek help of supportive people (AA meetings) if you struggle to stay sober.  IMPORTANT: Need to Follow up with GI - Dr. Chales Abrahams in 3 months- for bleeding and inflamed stomach IMPORTANT: Need to Follow up with Neurology in 1 month - Dr. Ailene Ards for seizures Primary care doctor can assist with coordinating your care.  Please make appointment and establish care with a primary doctor as well.   Increase activity slowly   Complete by: As directed    Increase activity slowly   Complete by: As directed      Allergies as of 01/15/2019      Reactions   Aspirin    shaking      Medication List    TAKE these medications   amLODipine 5 MG tablet Commonly known as: NORVASC Take 1 tablet (5 mg total) by mouth daily.   folic acid 1 MG tablet Commonly known as: FOLVITE Take 1 tablet (1 mg total) by mouth daily.   levETIRAcetam 500 MG tablet Commonly known as: KEPPRA Take 1 tablet (500 mg total) by mouth 2 (two) times daily.   multivitamin with minerals Tabs tablet Take 1 tablet by mouth daily.   pantoprazole 40 MG tablet Commonly known as: PROTONIX Take 1 tablet (40 mg total) by mouth 2 (two) times daily.   thiamine 100 MG tablet Take 1 tablet (100 mg total) by mouth daily.      Follow-up Information    [facility] Follow up.   Why: case manager will call you with your appointment time Contact information: [address] [facility] [address] [phone]         Allergies  Allergen Reactions  . Aspirin     shaking    Consultations:  Gastroenterology, Dr. Chales Abrahams   Procedures/Studies: Ct Head Wo Contrast  Result Date: 01/12/2019 CLINICAL DATA:  58 year old male with seizure witnessed  by bystander. Altered mental status. EXAM: CT HEAD WITHOUT CONTRAST TECHNIQUE: Contiguous axial images were obtained from the base of the skull through the vertex without intravenous contrast. COMPARISON:  Head CT 10/22/2006 [facility] FINDINGS: Brain: Mild cerebral volume loss since 2008 appears generalized. No midline shift, ventriculomegaly, mass effect, evidence of mass lesion, intracranial hemorrhage or evidence of cortically based acute infarction. Gray-white matter differentiation is within normal limits throughout the brain. Vascular: Minimal Calcified atherosclerosis at the skull base. No suspicious intracranial vascular hyperdensity. Skull: No acute osseous abnormality identified. Sinuses/Orbits: Mild right maxillary sinus mucosal thickening, otherwise clear. Other: Chronic left posterior scalp lipoma. No acute orbit or scalp soft tissue findings. IMPRESSION: No acute intracranial abnormality. Negative for age non contrast CT appearance of the brain. Electronically Signed   By: Odessa Fleming M.D.   On: 01/12/2019 12:06   Ct Abdomen Pelvis W Contrast  Result Date: 01/14/2019 CLINICAL DATA:  GI bleeding EXAM: CT ABDOMEN AND PELVIS WITH CONTRAST TECHNIQUE: Multidetector CT imaging of the abdomen and pelvis was performed using the standard protocol following bolus administration of intravenous  contrast. CONTRAST:  131mL OMNIPAQUE IOHEXOL 300 MG/ML  SOLN COMPARISON:  None. FINDINGS: Lower chest: There is a linear airspace opacity at the left lung base favored to represent atelectasis.The heart size is normal. Hepatobiliary: The liver is normal. Normal gallbladder.There is no biliary ductal dilation. Pancreas: Are some calcifications in the region of the pancreatic head. Spleen: No splenic laceration or hematoma. Adrenals/Urinary Tract: --Adrenal glands: No adrenal hemorrhage. --Right kidney/ureter: No hydronephrosis or perinephric hematoma. --Left kidney/ureter: No hydronephrosis or  perinephric hematoma. --Urinary bladder: There is mild bladder wall thickening. Stomach/Bowel: --Stomach/Duodenum: No hiatal hernia or other gastric abnormality. Normal duodenal course and caliber. --Small bowel: No dilatation or inflammation. --Colon: No focal abnormality. --Appendix: Normal. Vascular/Lymphatic: Atherosclerotic calcification is present within the non-aneurysmal abdominal aorta, without hemodynamically significant stenosis. --No retroperitoneal lymphadenopathy. --No mesenteric lymphadenopathy. --No pelvic or inguinal lymphadenopathy. Reproductive: Unremarkable Other: No ascites or free air. The abdominal wall is normal. Musculoskeletal. No acute displaced fractures. IMPRESSION: 1. Study is essentially nondiagnostic for the detection of active GI bleeding given the presence of oral contrast throughout the bowel. 2. No specific abnormality detected to explain the patient's GI bleeding. 3. Calcifications near the head of the pancreas may be related to chronic pancreatitis. 4. Airspace opacity at the left lung base favored to represent atelectasis. 5. Mild bladder wall thickening. Correlation with urinalysis is recommended. Electronically Signed   By: Constance Holster M.D.   On: 01/14/2019 19:56   (Echo, Carotid, EGD, Colonoscopy, ERCP)    Subjective: Patient seen and examined this morning.  Denies any blood in his stool or nausea vomiting.  Denies symptoms of alcohol withdrawal.  No acute events reported overnight.  Discharge Exam: Vitals:   01/15/19 0510 01/15/19 0847  BP: 137/84 139/88  Pulse: 78 73  Resp: 18 18  Temp: 98.7 F (37.1 C) 98.2 F (36.8 C)  SpO2: 99% 100%   Vitals:   01/14/19 1635 01/14/19 2105 01/15/19 0510 01/15/19 0847  BP: (!) 154/90 (!) 143/89 137/84 139/88  Pulse: 68 84 78 73  Resp: 18 18 18 18   Temp: 98.8 F (37.1 C) 98.9 F (37.2 C) 98.7 F (37.1 C) 98.2 F (36.8 C)  TempSrc: Oral Oral Oral Oral  SpO2: 100% 100% 99% 100%  Weight:      Height:         General: Pt is alert, awake, not in acute distress, underweight Cardiovascular: RRR, S1/S2 +, no rubs, no gallops Respiratory: CTA bilaterally, no wheezing, no rhonchi Abdominal: Soft, NT, ND, bowel sounds + Extremities: no edema, no cyanosis    The results of significant diagnostics from this hospitalization (including imaging, microbiology, ancillary and laboratory) are listed below for reference.     Microbiology: Recent Results (from the past 240 hour(s))  SARS Coronavirus 2 by RT PCR (hospital order, performed in [facility] hospital lab) Nasopharyngeal Nasopharyngeal Swab     Status: None   Collection Time: 01/12/19  2:15 PM   Specimen: Nasopharyngeal Swab  Result Value Ref Range Status   SARS Coronavirus 2 NEGATIVE NEGATIVE Final    Comment: (NOTE) If result is NEGATIVE SARS-CoV-2 target nucleic acids are NOT DETECTED. The SARS-CoV-2 RNA is generally detectable in upper and lower  respiratory specimens during the acute phase of infection. The lowest  concentration of SARS-CoV-2 viral copies this assay can detect is 250  copies / mL. A negative result does not preclude SARS-CoV-2 infection  and should not be used as the sole basis for treatment or other  patient management  decisions.  A negative result may occur with  improper specimen collection / handling, submission of specimen other  than nasopharyngeal swab, presence of viral mutation(s) within the  areas targeted by this assay, and inadequate number of viral copies  (<250 copies / mL). A negative result must be combined with clinical  observations, patient history, and epidemiological information. If result is POSITIVE SARS-CoV-2 target nucleic acids are DETECTED. The SARS-CoV-2 RNA is generally detectable in upper and lower  respiratory specimens dur ing the acute phase of infection.  Positive  results are indicative of active infection with SARS-CoV-2.  Clinical  correlation with patient history and other  diagnostic information is  necessary to determine patient infection status.  Positive results do  not rule out bacterial infection or co-infection with other viruses. If result is PRESUMPTIVE POSTIVE SARS-CoV-2 nucleic acids MAY BE PRESENT.   A presumptive positive result was obtained on the submitted specimen  and confirmed on repeat testing.  While 2019 novel coronavirus  (SARS-CoV-2) nucleic acids may be present in the submitted sample  additional confirmatory testing may be necessary for epidemiological  and / or clinical management purposes  to differentiate between  SARS-CoV-2 and other Sarbecovirus currently known to infect humans.  If clinically indicated additional testing with an alternate test  methodology [phone]([id number]) is advised. The SARS-CoV-2 RNA is generally  detectable in upper and lower respiratory sp ecimens during the acute  phase of infection. The expected result is Negative. Fact Sheet for Patients:  BoilerBrush.com.cyhttps://www.fda.gov/media/136312/download Fact Sheet for Healthcare Providers: https://pope.com/https://www.fda.gov/media/136313/download This test is not yet approved or cleared by the [place] FDA and has been authorized for detection and/or diagnosis of SARS-CoV-2 by FDA under an Emergency Use Authorization (EUA).  This EUA will remain in effect (meaning this test can be used) for the duration of the COVID-19 declaration under Section 564(b)(1) of the Act, 21 U.S.C. section 360bbb-3(b)(1), unless the authorization is terminated or revoked sooner. Performed at [facility], [address] [place], [place] [address]      Labs: BNP (last 3 results) No results for input(s): BNP in the last 8760 hours. Basic Metabolic Panel: Recent Labs  Lab 01/12/19 1022 01/12/19 1500 01/12/19 1520 01/14/19 0413 01/15/19 0610  NA 134*  --  135 136 137  K 3.5  --  3.5 3.1* 3.2*  CL 98  --   --  103 106  CO2 17*  --   --  22 21*  GLUCOSE 98  --   --  100* 102*  BUN <5*  --   --   <5* <5*  CREATININE 0.90  --   --  0.77 0.82  CALCIUM 9.1  --   --  8.9 8.6*  MG  --  1.5*  --  1.4*  --   PHOS  --  3.6  --   --   --    Liver Function Tests: Recent Labs  Lab 01/12/19 1022 01/14/19 0413  AST 125* 148*  ALT 44 43  ALKPHOS 125 91  BILITOT 0.7 0.9  PROT 8.2* 7.3  ALBUMIN 3.5 3.1*   No results for input(s): LIPASE, AMYLASE in the last 168 hours. No results for input(s): AMMONIA in the last 168 hours. CBC: Recent Labs  Lab 01/12/19 1022 01/12/19 1520  01/12/19 2347 01/13/19 0608 01/13/19 1740 01/14/19 0413 01/15/19 0610  WBC 3.0*  --   --   --   --   --  5.7 7.9  NEUTROABS 2.1  --   --   --   --   --   --  5.4  HGB 7.0* 8.5*   < > 7.8* 7.5* 8.2* 7.9* 7.4*  HCT 25.0* 25.0*  --   --   --   --  26.2* 25.4*  MCV 74.4*  --   --   --   --   --  74.9* 77.0*  PLT 243  --   --   --   --   --  222 238   < > = values in this interval not displayed.   Cardiac Enzymes: No results for input(s): CKTOTAL, CKMB, CKMBINDEX, TROPONINI in the last 168 hours. BNP: Invalid input(s): POCBNP CBG: No results for input(s): GLUCAP in the last 168 hours. D-Dimer No results for input(s): DDIMER in the last 72 hours. Hgb A1c No results for input(s): HGBA1C in the last 72 hours. Lipid Profile No results for input(s): CHOL, HDL, LDLCALC, TRIG, CHOLHDL, LDLDIRECT in the last 72 hours. Thyroid function studies No results for input(s): TSH, T4TOTAL, T3FREE, THYROIDAB in the last 72 hours.  Invalid input(s): FREET3 Anemia work up No results for input(s): VITAMINB12, FOLATE, FERRITIN, TIBC, IRON, RETICCTPCT in the last 72 hours. Urinalysis No results found for: COLORURINE, APPEARANCEUR, LABSPEC, PHURINE, GLUCOSEU, HGBUR, BILIRUBINUR, KETONESUR, PROTEINUR, UROBILINOGEN, NITRITE, LEUKOCYTESUR Sepsis Labs Invalid input(s): PROCALCITONIN,  WBC,  LACTICIDVEN Microbiology Recent Results (from the past 240 hour(s))  SARS Coronavirus 2 by RT PCR (hospital order, performed in [facility] hospital lab) Nasopharyngeal Nasopharyngeal Swab     Status: None   Collection Time: 01/12/19  2:15 PM   Specimen: Nasopharyngeal Swab  Result Value Ref Range Status   SARS Coronavirus 2 NEGATIVE NEGATIVE Final    Comment: (NOTE) If result is NEGATIVE SARS-CoV-2 target nucleic acids are NOT DETECTED. The SARS-CoV-2 RNA is generally detectable in upper and lower  respiratory specimens during the acute phase of infection. The lowest  concentration of SARS-CoV-2 viral copies this assay can detect is 250  copies / mL. A negative result does not preclude SARS-CoV-2 infection  and should not be used as the sole basis for treatment or other  patient management decisions.  A negative result may occur with  improper specimen collection / handling, submission of specimen other  than nasopharyngeal swab, presence of viral mutation(s) within the  areas targeted by this assay, and inadequate number of viral copies  (<250 copies / mL). A negative result must be combined with clinical  observations, patient history, and epidemiological information. If result is POSITIVE SARS-CoV-2 target nucleic acids are DETECTED. The SARS-CoV-2 RNA is generally detectable in upper and lower  respiratory specimens dur ing the acute phase of infection.  Positive  results are indicative of active infection with SARS-CoV-2.  Clinical  correlation with patient history and other diagnostic information is  necessary to determine patient infection status.  Positive results do  not rule out bacterial infection or co-infection with other viruses. If result is PRESUMPTIVE POSTIVE SARS-CoV-2 nucleic acids MAY BE PRESENT.   A presumptive positive result was obtained on the submitted specimen  and confirmed on repeat testing.  While 2019 novel coronavirus  (SARS-CoV-2) nucleic acids may be present in the submitted sample  additional confirmatory testing may be necessary for epidemiological  and / or clinical management  purposes  to differentiate between  SARS-CoV-2 and other Sarbecovirus currently known to infect humans.  If clinically indicated additional testing with an alternate test  methodology [phone]) is advised. The SARS-CoV-2 RNA is generally  detectable in upper and lower respiratory sp ecimens during the acute  phase of infection. The expected result is Negative. Fact Sheet for Patients:  BoilerBrush.com.cy Fact Sheet for Healthcare Providers: https://pope.com/ This test is not yet approved or cleared by the [place] FDA and has been authorized for detection and/or diagnosis of SARS-CoV-2 by FDA under an Emergency Use Authorization (EUA).  This EUA will remain in effect (meaning this test can be used) for the duration of the COVID-19 declaration under Section 564(b)(1) of the Act, 21 U.S.C. section 360bbb-3(b)(1), unless the authorization is terminated or revoked sooner. Performed at [facility], [address], [place], [place] [address]      Time coordinating discharge: Over 30 minutes  SIGNED:   Pennie Banter, DO  [facility] 01/15/2019, 6:25 PM Pager [phone]  If 7PM-7AM, please contact night-coverage [email or web address] Password [email or web address]

## 2019-01-16 ENCOUNTER — Telehealth: Payer: Self-pay | Admitting: *Deleted

## 2019-01-16 NOTE — Telephone Encounter (Signed)
TOC CM contacted pt's contact, Margaretmary Dys per pt request (pt does not have a phone) to give information on follow up appt. Appt arranged at [facility] on 02/15/2019 at 2:30 pm. Sister will follow up with pt to see if he got his meds. Explained he was provided with M[employer] letter with $3 copay and once per year use. She will assist with Medicaid and disability applications. [place], [place] [facility] TOC CM [phone]

## 2019-01-17 ENCOUNTER — Other Ambulatory Visit: Payer: Self-pay | Admitting: Physician Assistant

## 2019-01-17 LAB — SURGICAL PATHOLOGY

## 2019-02-15 ENCOUNTER — Inpatient Hospital Stay: Payer: Self-pay | Admitting: Family Medicine

## 2019-02-15 MED FILL — ?FOLIC ACID 1MG TAB: 1 | 30 days supply | Qty: 30 | Fill #0

## 2019-02-15 MED FILL — levETIRAcetam 500 MG TABS: 500 | 30 days supply | Qty: 60 | Fill #0

## 2019-02-15 MED FILL — ?AMLODIPINE BESYLATE 5 MG T: 5 MG | 30 days supply | Qty: 30 | Fill #0

## 2019-02-18 NOTE — Progress Notes (Deleted)
Patient ID: Scott Choi, male   DOB: [birth year], 58 y.o.   MRN: [id number] Virtual Visit via Telephone Note  I connected with Scott Choi on 02/18/19 at  8:50 AM EST by telephone and verified that I am speaking with the correct person using two identifiers.   Consent:  I discussed the limitations, risks, security and privacy concerns of performing an evaluation and management service by telephone and the availability of in person appointments. I also discussed with the patient that there may be a patient responsible charge related to this service. The patient expressed understanding and agreed to proceed.  Location of patient:  Location of provider:  Persons participating in the televisit with the patient.       History of Present Illness:  Admit date: 01/12/2019 Discharge date: 01/15/2019  Admitted From: Home  Disposition: Home  Recommendations for Outpatient Follow-up:  1. Follow up with PCP in 1-2 weeks 2. Please obtain BMP/CBC in one week 3. Please follow up on the following pending results:  Home Health: No Equipment/Devices: No  Discharge Condition: Stable  CODE STATUS: Full Diet recommendation: Regular  Brief/Interim Summary: Scott Choi a58 y.o.male,with past medical history significant for alcoholism, brought to our facility by EMS for witnessed seizure by a bystander. Patient reports his last drink was yesterday(10/7). EMS reportedpostictal state.In the emergency room mental status continued to deteriorate and he was found to be anemic. Patient has history of taking nonsteroidals. His stool guaiac was negative but his hemoglobin was 7. Patient reporteddark tarrystoolsand occasional coffee ground emesis with occasional bright red blood. Admitted for blood transfusion and monitoring with CIWA protocol. Received one unit packed red blood cells. Trending hemoglobin. GI and neurology consulted for further evaluations of his seizure and  apparent GI bleeding.   Patient reports seizures in the past, one a couple months ago and at least one other in the distant past. He had only been without alcohol for 24 hours when the seizure occurred. He does not recall whether prior seizures were related to alcohol withdrawal but thinks they were not. No further seizure activity occurred during admission.  Case discussed with neurologist, Dr. Rory Percy, who recommended loading and then continuing [place], with outpatient neurology follow-up.  No EEG needed while inpatient given patient's full recovery to baseline and no further seizure activity.  Patient underwent EGD and colonoscopy, neither of which identified an active source of blood loss.  EGD did show esophagitis and gastritis along with a small hiatal hernia.  Colonoscopy was remarkable only for internal hemorrhoids.  GI requested a CT of abdomen pelvis to rule out other pathology.  CT was obtained no acute or chronic abnormalities were identified with the exception of possible chronic pancreatitis.  He was started on Protonix, and advised to avoid all NSAIDs, aspirin, and alcohol.  Patient also started on amlodipine for hypertension noted during admission.  Patient counseled to establish with primary care for close follow-up. Social work assisted with arranging for that appointment.   Discharge Diagnoses:  Principal Problem:   Iron deficiency anemia due to chronic blood loss Active Problems:   Seizure (HCC)   Alcohol dependence with uncomplicated withdrawal (HCC)   Gastritis   Esophagitis   Hiatal hernia   Essential hypertension     Constitutional:   No  weight loss, night sweats,  Fevers, chills, fatigue, lassitude. HEENT:   No headaches,  Difficulty swallowing,  Tooth/dental problems,  Sore throat,  No sneezing, itching, ear ache, nasal congestion, post nasal drip,   CV:  No chest pain,  Orthopnea, PND, swelling in lower extremities, anasarca, dizziness,  palpitations  GI  No heartburn, indigestion, abdominal pain, nausea, vomiting, diarrhea, change in bowel habits, loss of appetite  Resp: No shortness of breath with exertion or at rest.  No excess mucus, no productive cough,  No non-productive cough,  No coughing up of blood.  No change in color of mucus.  No wheezing.  No chest wall deformity  Skin: no rash or lesions.  GU: no dysuria, change in color of urine, no urgency or frequency.  No flank pain.  MS:  No joint pain or swelling.  No decreased range of motion.  No back pain.  Psych:  No change in mood or affect. No depression or anxiety.  No memory loss.    Observations/Objective:   Assessment and Plan:   Follow Up Instructions:    I discussed the assessment and treatment plan with the patient. The patient was provided an opportunity to ask questions and all were answered. The patient agreed with the plan and demonstrated an understanding of the instructions.   The patient was advised to call back or seek an in-person evaluation if the symptoms worsen or if the condition fails to improve as anticipated.  I provided *** minutes of non-face-to-face time during this encounter  including  median intraservice time , review of notes, labs, imaging, medications  and explaining diagnosis and management to the patient .    Shan Levans, MD

## 2019-02-20 ENCOUNTER — Other Ambulatory Visit: Payer: Self-pay

## 2019-02-20 ENCOUNTER — Ambulatory Visit: Payer: Self-pay | Attending: Critical Care Medicine | Admitting: Critical Care Medicine

## 2019-03-16 ENCOUNTER — Inpatient Hospital Stay: Payer: Self-pay | Admitting: Internal Medicine

## 2019-04-24 ENCOUNTER — Other Ambulatory Visit: Payer: Self-pay

## 2019-04-24 ENCOUNTER — Encounter: Payer: Self-pay | Admitting: Internal Medicine

## 2019-04-24 ENCOUNTER — Ambulatory Visit: Payer: Self-pay | Attending: Internal Medicine | Admitting: Internal Medicine

## 2019-04-24 VITALS — BP 189/100 | HR 92 | Temp 98.7°F | Resp 16 | Ht 69.5 in | Wt 149.6 lb

## 2019-04-24 DIAGNOSIS — R3912 Poor urinary stream: Secondary | ICD-10-CM

## 2019-04-24 DIAGNOSIS — D509 Iron deficiency anemia, unspecified: Secondary | ICD-10-CM

## 2019-04-24 DIAGNOSIS — N401 Enlarged prostate with lower urinary tract symptoms: Secondary | ICD-10-CM

## 2019-04-24 DIAGNOSIS — F1011 Alcohol abuse, in remission: Secondary | ICD-10-CM

## 2019-04-24 DIAGNOSIS — Z5900 Homelessness unspecified: Secondary | ICD-10-CM

## 2019-04-24 DIAGNOSIS — I10 Essential (primary) hypertension: Secondary | ICD-10-CM

## 2019-04-24 DIAGNOSIS — G40909 Epilepsy, unspecified, not intractable, without status epilepticus: Secondary | ICD-10-CM

## 2019-04-24 DIAGNOSIS — Z2821 Immunization not carried out because of patient refusal: Secondary | ICD-10-CM

## 2019-04-24 MED ORDER — AMLODIPINE BESYLATE 5 MG PO TABS: 7.5000 mg | ORAL_TABLET | Freq: Every day | ORAL | 6 refills | Status: DC

## 2019-04-24 MED ORDER — FERROUS SULFATE 325 (65 FE) MG PO TABS: 325.0000 mg | ORAL_TABLET | Freq: Every day | ORAL | 1 refills | Status: DC

## 2019-04-24 MED ORDER — TAMSULOSIN HCL 0.4 MG PO CAPS: 0.4000 mg | ORAL_CAPSULE | Freq: Every day | ORAL | 3 refills | Status: DC

## 2019-04-24 MED ORDER — LEVETIRACETAM 500 MG PO TABS: 500.0000 mg | ORAL_TABLET | Freq: Two times a day (BID) | ORAL | 5 refills | Status: AC

## 2019-04-24 MED FILL — AMLODIPINE BESYLATE 5 MG TA: 5 | 30 days supply | Qty: 45 | Fill #0

## 2019-04-24 MED FILL — FERROUS SULFATE 325 MG TAB: 325 (65 FE) | 30 days supply | Qty: 30 | Fill #0

## 2019-04-24 MED FILL — TAMSULOSIN HCL 0.4 MG CAP: 0.4 | 30 days supply | Qty: 30 | Fill #0

## 2019-04-24 MED FILL — levETIRAcetam 500 MG TABS: 500 | 30 days supply | Qty: 60 | Fill #0

## 2019-04-24 NOTE — Progress Notes (Signed)
Patient ID: Scott Choi, male    DOB: 1960/12/16  MRN: [id number]  CC: Hospitalization Follow-up   Subjective: Scott Choi is a 59 y.o. male who presents for new pt visit and hosp f/u His concerns today include:  Patient with history of EtOH abuse, seizure disorder, HTN, iron deficiency anemia, hiatal hernia, former smoker  Patient is new to this practice.  No previous PCP in the past several years. Hospitalized 10/8-02/2019 for witnessed seizure.  He gave history of at least 2 other seizures in the past.  He was never on medication.  He also gave history of EtOH use.  Case was discussed with neurologist who recommended starting the patient on Keppra.  No EEG done while in hospital. Patient was found to be anemic with a hemoglobin of 7 on admission.  Stools were guaiac negative.  -He was transfused 1 unit of packed red blood cell.  Colonoscopy was unremarkable.  EGD showed some esophagitis and gastritis along with a small hiatal hernia.  CAT scan of the abdomen was negative for any acute or chronic abnormalities except for possible chronic pancreatitis.  He was started on Protonix and advised to avoid NSAIDs, aspirin and discontinue EtOH use. -Patient also found to have hypertension and was started on amlodipine.  Today: Seizure disorder: No seizures since hospital discharge.  He has been out of Keppra for about a month.  He tells me that he stop drinking completely as of 02/18/2019.  He remembers this day because it was the day after he buried his brother.  He reports history of significant EtOH use and had gone through treatment programs in the past including AA. Still gets cravings but not as bad as when he quit 2 mths ago.  Quit smoking cigarettes 3 years ago.  Iron deficiency anemia: Iron studies in the hospital consistent with IDA.  Was not discharged on iron.  Reports feeling cold all the time.  Some fatigue if he does a lot of walking.  Reports occasional dizziness.  HTN: He has  been out of of amlodipine.  He denies any chest pains or shortness of breath.  No lower extremity edema.  He is homeless.  He stays with several different friends off and on.  C/o Slow urine flow x several mths.  Endorses stop and go urine stream. Wakes up 3 times a night to urinate.  No hematuria  Past medical, social, family history and surgical history reviewed. Imaging studies done during hospitalization of 01/2019 reviewed.  Patient Active Problem List   Diagnosis Date Noted  . Alcohol abuse, in remission 04/24/2019  . Influenza vaccination declined 04/24/2019  . Homeless 04/24/2019  . Gastritis 01/15/2019  . Esophagitis 01/15/2019  . Hiatal hernia 01/15/2019  . Essential hypertension 01/15/2019  . Alcohol dependence with uncomplicated withdrawal (HCC) 01/13/2019  . Iron deficiency anemia due to chronic blood loss 01/13/2019  . Seizure disorder (HCC) 01/13/2019     Current Outpatient Medications on File Prior to Visit  Medication Sig Dispense Refill  . Multiple Vitamin (MULTIVITAMIN WITH MINERALS) TABS tablet Take 1 tablet by mouth daily. 30 tablet 1  . pantoprazole (PROTONIX) 40 MG tablet Take 1 tablet (40 mg total) by mouth 2 (two) times daily. 30 tablet 1   No current facility-administered medications on file prior to visit.    Allergies  Allergen Reactions  . Aspirin     shaking    Social History   Socioeconomic History  . Marital status: Married    Spouse  name: Not on file  . Number of children: Not on file  . Years of education: Not on file  . Highest education level: Not on file  Occupational History  . Not on file  Tobacco Use  . Smoking status: Former Research scientist (life sciences)  . Smokeless tobacco: Never Used  Substance and Sexual Activity  . Alcohol use: Yes    Alcohol/week: 42.0 standard drinks    Types: 28 Cans of beer, 14 Shots of liquor per week  . Drug use: Yes    Types: Marijuana  . Sexual activity: Never  Other Topics Concern  . Not on file  Social  History Narrative  . Not on file   Social Determinants of Health   Financial Resource Strain:   . Difficulty of Paying Living Expenses: Not on file  Food Insecurity:   . Worried About Charity fundraiser in the Last Year: Not on file  . Ran Out of Food in the Last Year: Not on file  Transportation Needs:   . Lack of Transportation (Medical): Not on file  . Lack of Transportation (Non-Medical): Not on file  Physical Activity:   . Days of Exercise per Week: Not on file  . Minutes of Exercise per Session: Not on file  Stress:   . Feeling of Stress : Not on file  Social Connections:   . Frequency of Communication with Friends and Family: Not on file  . Frequency of Social Gatherings with Friends and Family: Not on file  . Attends Religious Services: Not on file  . Active Member of Clubs or Organizations: Not on file  . Attends Archivist Meetings: Not on file  . Marital Status: Not on file  Intimate Partner Violence:   . Fear of Current or Ex-Partner: Not on file  . Emotionally Abused: Not on file  . Physically Abused: Not on file  . Sexually Abused: Not on file    No family history on file.  Past Surgical History:  Procedure Laterality Date  . BIOPSY  01/14/2019   Procedure: BIOPSY;  Surgeon: Jackquline Denmark, MD;  Location: [facility];  Service: Endoscopy;;  . COLONOSCOPY WITH PROPOFOL N/A 01/14/2019   Procedure: COLONOSCOPY WITH PROPOFOL;  Surgeon: Jackquline Denmark, MD;  Location: [facility] ENDOSCOPY;  Service: Endoscopy;  Laterality: N/A;  . ESOPHAGOGASTRODUODENOSCOPY N/A 01/14/2019   Procedure: ESOPHAGOGASTRODUODENOSCOPY (EGD);  Surgeon: Jackquline Denmark, MD;  Location[facility] ENDOSCOPY;  Service: Endoscopy;  Laterality: N/A;    ROS: Review of Systems Negative except as stated above  PHYSICAL EXAM: BP (!) 189/100   Pulse 92   Temp 98.7 F (37.1 C) (Oral)   Resp 16   Ht 5' 9.5" (1.765 m)   Wt 149 lb 9.6 oz (67.9 kg)   SpO2 99%   BMI 21.78 kg/m   Physical  Exam  General appearance - alert, well appearing, and in no distress Mental status - normal mood, behavior, speech, dress, motor activity, and thought processes Eyes - pupils equal and reactive, extraocular eye movements intact Nose - normal and patent, no erythema, discharge or polyps Mouth - mucous membranes moist, pharynx normal without lesions Neck - supple, no significant adenopathy Lymphatics - no palpable cervical or axillary lymphadenopathy, no hepatosplenomegaly Chest - clear to auscultation, no wheezes, rales or rhonchi, symmetric air entry Heart - normal rate, regular rhythm, normal S1, S2, no murmurs, rubs, clicks or gallops Abdomen - soft, nontender, nondistended, no masses or organomegaly Rectal - decline rectal exam to check prostate Extremities - peripheral  pulses normal, no pedal edema, no clubbing or cyanosis   CMP Latest Ref Rng & Units 01/15/2019 01/14/2019 01/12/2019  Glucose 70 - 99 mg/dL 852(D) 782(U) -  BUN 6 - 20 mg/dL <2(P) <5(T) -  Creatinine 0.61 - 1.24 mg/dL 6.14 4.31 -  Sodium 540 - 145 mmol/L 137 136 135  Potassium 3.5 - 5.1 mmol/L 3.2(L) 3.1(L) 3.5  Chloride 98 - 111 mmol/L 106 103 -  CO2 22 - 32 mmol/L 21(L) 22 -  Calcium 8.9 - 10.3 mg/dL 0.8(Q) 8.9 -  Total Protein 6.5 - 8.1 g/dL - 7.3 -  Total Bilirubin 0.3 - 1.2 mg/dL - 0.9 -  Alkaline Phos 38 - 126 U/L - 91 -  AST 15 - 41 U/L - 148(H) -  ALT 0 - 44 U/L - 43 -   Lipid Panel  No results found for: CHOL, TRIG, HDL, CHOLHDL, VLDL, LDLCALC, LDLDIRECT  CBC    Component Value Date/Time   WBC 7.9 01/15/2019 0610   RBC 3.30 (L) 01/15/2019 0610   HGB 7.4 (L) 01/15/2019 0610   HCT 25.4 (L) 01/15/2019 0610   PLT 238 01/15/2019 0610   MCV 77.0 (L) 01/15/2019 0610   MCH 22.4 (L) 01/15/2019 0610   MCHC 29.1 (L) 01/15/2019 0610   RDW 21.5 (H) 01/15/2019 0610   LYMPHSABS 1.0 01/15/2019 0610   MONOABS 1.3 (H) 01/15/2019 0610   EOSABS 0.1 01/15/2019 0610   BASOSABS 0.1 01/15/2019 0610    ASSESSMENT  AND PLAN:  1. Essential hypertension Not at goal. Refill given on amlodipine which she has been out of. DASH diet discussed and encouraged. Follow-up with clinical pharmacist in 2 weeks for blood pressure recheck. - amLODipine (NORVASC) 5 MG tablet; Take 1.5 tablets (7.5 mg total) by mouth daily.  Dispense: 30 tablet; Refill: 6 - CBC - Comprehensive metabolic panel - Lipid panel  2. Iron deficiency anemia, unspecified iron deficiency anemia type Recheck CBC today.  I have started him on iron supplement. - ferrous sulfate 325 (65 FE) MG tablet; Take 1 tablet (325 mg total) by mouth daily with breakfast.  Dispense: 100 tablet; Refill: 1  3. Alcohol abuse, in remission Commended him on quitting.  Encouraged him to remain free of alcohol.  He does not feel that he needs to go through any treatment program at this time.  4. Seizure disorder (HCC) Refill Keppra. - levETIRAcetam (KEPPRA) 500 MG tablet; Take 1 tablet (500 mg total) by mouth 2 (two) times daily.  Dispense: 60 tablet; Refill: 5  5. Benign prostatic hyperplasia with weak urinary stream Symptoms likely due to BPH.  Patient declined rectal exam.  We will give a trial of Flomax. - tamsulosin (FLOMAX) 0.4 MG CAPS capsule; Take 1 capsule (0.4 mg total) by mouth daily.  Dispense: 30 capsule; Refill: 3 - PSA  6. Influenza vaccination declined Offered and declined by pt  7. Homeless   Patient was given the opportunity to ask questions.  Patient verbalized understanding of the plan and was able to repeat key elements of the plan.   Orders Placed This Encounter  Procedures  . CBC  . Comprehensive metabolic panel  . Lipid panel  . PSA     Requested Prescriptions   Signed Prescriptions Disp Refills  . levETIRAcetam (KEPPRA) 500 MG tablet 60 tablet 5    Sig: Take 1 tablet (500 mg total) by mouth 2 (two) times daily.  Marland Kitchen amLODipine (NORVASC) 5 MG tablet 30 tablet 6    Sig: Take 1.5  tablets (7.5 mg total) by mouth daily.  .  tamsulosin (FLOMAX) 0.4 MG CAPS capsule 30 capsule 3    Sig: Take 1 capsule (0.4 mg total) by mouth daily.  . ferrous sulfate 325 (65 FE) MG tablet 100 tablet 1    Sig: Take 1 tablet (325 mg total) by mouth daily with breakfast.    Return in about 2 months (around 06/22/2019).  Jonah Blue, MD, FACP

## 2019-04-24 NOTE — Patient Instructions (Signed)
Give patient a follow-up appointment with the clinical pharmacist in 2 weeks.

## 2019-04-25 ENCOUNTER — Other Ambulatory Visit: Payer: Self-pay | Admitting: Internal Medicine

## 2019-04-25 DIAGNOSIS — R7989 Other specified abnormal findings of blood chemistry: Secondary | ICD-10-CM

## 2019-04-25 DIAGNOSIS — R945 Abnormal results of liver function studies: Secondary | ICD-10-CM

## 2019-04-25 LAB — LIPID PANEL
Chol/HDL Ratio: 2.9 ratio (ref 0.0–5.0)
Cholesterol, Total: 174 mg/dL (ref 100–199)
HDL: 59 mg/dL (ref >39)
LDL Chol Calc (NIH): 98 mg/dL (ref 0–99)
Triglycerides: 90 mg/dL (ref 0–149)
VLDL Cholesterol Cal: 17 mg/dL (ref 5–40)

## 2019-04-25 LAB — COMPREHENSIVE METABOLIC PANEL
ALT: 54 IU/L — ABNORMAL HIGH (ref 0–44)
AST: 59 IU/L — ABNORMAL HIGH (ref 0–40)
Albumin/Globulin Ratio: 1.2 (ref 1.2–2.2)
Albumin: 4.3 g/dL (ref 3.8–4.9)
Alkaline Phosphatase: 87 IU/L (ref 39–117)
BUN/Creatinine Ratio: 6 — ABNORMAL LOW (ref 9–20)
BUN: 5 mg/dL — ABNORMAL LOW (ref 6–24)
Bilirubin Total: <0.2 mg/dL (ref 0.0–1.2)
CO2: 25 mmol/L (ref 20–29)
Calcium: 9.6 mg/dL (ref 8.7–10.2)
Chloride: 102 mmol/L (ref 96–106)
Creatinine, Ser: 0.8 mg/dL (ref 0.76–1.27)
GFR calc Af Amer: 115 mL/min/{1.73_m2} (ref >59)
GFR calc non Af Amer: 100 mL/min/{1.73_m2} (ref >59)
Globulin, Total: 3.5 g/dL (ref 1.5–4.5)
Glucose: 91 mg/dL (ref 65–99)
Potassium: 4.6 mmol/L (ref 3.5–5.2)
Sodium: 140 mmol/L (ref 134–144)
Total Protein: 7.8 g/dL (ref 6.0–8.5)

## 2019-04-25 LAB — CBC
Hematocrit: 29.3 % — ABNORMAL LOW (ref 37.5–51.0)
Hemoglobin: 8.4 g/dL — ABNORMAL LOW (ref 13.0–17.7)
MCH: 21.2 pg — ABNORMAL LOW (ref 26.6–33.0)
MCHC: 28.7 g/dL — ABNORMAL LOW (ref 31.5–35.7)
MCV: 74 fL — ABNORMAL LOW (ref 79–97)
Platelets: 318 10*3/uL (ref 150–450)
RBC: 3.97 x10E6/uL — ABNORMAL LOW (ref 4.14–5.80)
RDW: 17.2 % — ABNORMAL HIGH (ref 11.6–15.4)
WBC: 5.7 10*3/uL (ref 3.4–10.8)

## 2019-04-25 LAB — PSA: Prostate Specific Ag, Serum: 0.4 ng/mL (ref 0.0–4.0)

## 2019-04-25 NOTE — Progress Notes (Signed)
Let patient know that he is still anemic but it has improved.  He should take the iron supplement (Ferrous Sulfate) daily as prescribed. This will help to correct the anemia.  Kidney function is normal.  Liver enzymes still elevated but improved compared to 01/2019. Continue to remain free of alcohol.  Please return to the lab to have screening test done for hepatitis C and B.  Cholesterol level is normal. PSA which is screening test for prostate cancer is normal.

## 2019-05-02 ENCOUNTER — Telehealth: Payer: Self-pay

## 2019-05-02 NOTE — Telephone Encounter (Signed)
Contacted pt to go over lab results pt didn't answer lvm asking pt to give a call back at his earliest convenience  

## 2019-07-11 ENCOUNTER — Ambulatory Visit: Payer: Self-pay | Attending: Internal Medicine | Admitting: Internal Medicine

## 2019-07-11 ENCOUNTER — Other Ambulatory Visit: Payer: Self-pay

## 2019-08-07 ENCOUNTER — Other Ambulatory Visit: Payer: Self-pay

## 2019-08-07 ENCOUNTER — Ambulatory Visit: Payer: Self-pay | Attending: Internal Medicine

## 2019-08-07 DIAGNOSIS — R945 Abnormal results of liver function studies: Secondary | ICD-10-CM

## 2019-08-07 DIAGNOSIS — R7989 Other specified abnormal findings of blood chemistry: Secondary | ICD-10-CM

## 2019-08-07 DIAGNOSIS — R768 Other specified abnormal immunological findings in serum: Secondary | ICD-10-CM

## 2019-08-07 MED FILL — TAMSULOSIN HCL 0.4 MG CAP: 0.4 | 30 days supply | Qty: 30 | Fill #1

## 2019-08-07 MED FILL — levETIRAcetam 500 MG TABS: 500 | 30 days supply | Qty: 60 | Fill #1

## 2019-08-07 MED FILL — FERROUS SULFATE 325 MG TAB: 325 (65 FE) | 30 days supply | Qty: 30 | Fill #1

## 2019-08-07 MED FILL — AMLODIPINE BESYLATE 5 MG TA: 5 | 30 days supply | Qty: 45 | Fill #1

## 2019-08-08 ENCOUNTER — Other Ambulatory Visit: Payer: Self-pay | Admitting: Internal Medicine

## 2019-08-08 DIAGNOSIS — R768 Other specified abnormal immunological findings in serum: Secondary | ICD-10-CM

## 2019-08-08 LAB — HEPATITIS C ANTIBODY: Hep C Virus Ab: >11 s/co ratio — ABNORMAL HIGH (ref 0.0–0.9)

## 2019-08-08 LAB — HEPATITIS B SURFACE ANTIGEN: Hepatitis B Surface Ag: NEGATIVE

## 2019-08-08 LAB — HEPATITIS B SURFACE ANTIBODY, QUANTITATIVE: Hepatitis B Surf Ab Quant: 24.9 m[IU]/mL (ref >9.9)

## 2019-08-09 ENCOUNTER — Telehealth: Payer: Self-pay

## 2019-08-09 NOTE — Telephone Encounter (Signed)
Contacted pt to go over lab results pt didn't answer lvm asking pt to give a call back at his earliest convenience  

## 2019-08-10 LAB — HCV RNA QUANT

## 2019-08-10 LAB — SPECIMEN STATUS REPORT

## 2019-08-11 ENCOUNTER — Telehealth: Payer: Self-pay | Admitting: Internal Medicine

## 2019-08-11 DIAGNOSIS — R768 Other specified abnormal immunological findings in serum: Secondary | ICD-10-CM

## 2019-08-15 ENCOUNTER — Ambulatory Visit: Payer: Self-pay | Attending: Internal Medicine | Admitting: Internal Medicine

## 2019-08-15 ENCOUNTER — Other Ambulatory Visit: Payer: Self-pay | Admitting: Internal Medicine

## 2019-08-15 ENCOUNTER — Other Ambulatory Visit: Payer: Self-pay

## 2019-08-15 ENCOUNTER — Encounter: Payer: Self-pay | Admitting: Internal Medicine

## 2019-08-15 VITALS — BP 165/93 | HR 60 | Temp 97.0°F | Resp 16 | Ht 69.5 in | Wt 156.8 lb

## 2019-08-15 DIAGNOSIS — R3912 Poor urinary stream: Secondary | ICD-10-CM

## 2019-08-15 DIAGNOSIS — R768 Other specified abnormal immunological findings in serum: Secondary | ICD-10-CM

## 2019-08-15 DIAGNOSIS — D509 Iron deficiency anemia, unspecified: Secondary | ICD-10-CM

## 2019-08-15 DIAGNOSIS — I10 Essential (primary) hypertension: Secondary | ICD-10-CM

## 2019-08-15 DIAGNOSIS — Z114 Encounter for screening for human immunodeficiency virus [HIV]: Secondary | ICD-10-CM

## 2019-08-15 DIAGNOSIS — Z7189 Other specified counseling: Secondary | ICD-10-CM

## 2019-08-15 DIAGNOSIS — N401 Enlarged prostate with lower urinary tract symptoms: Secondary | ICD-10-CM

## 2019-08-15 MED ORDER — AMLODIPINE BESYLATE 10 MG PO TABS: 10.0000 mg | ORAL_TABLET | Freq: Every day | ORAL | 6 refills | Status: DC

## 2019-08-15 MED ORDER — TAMSULOSIN HCL 0.4 MG PO CAPS: 0.4000 mg | ORAL_CAPSULE | Freq: Every day | ORAL | 6 refills | Status: DC

## 2019-08-15 NOTE — Patient Instructions (Signed)
Your blood pressure is not well controlled.  We have increase your blood pressure medication amlodipine to 10 mg daily.  Please follow-up in 2 weeks to meet with our clinical pharmacist for blood pressure recheck.

## 2019-08-15 NOTE — Progress Notes (Signed)
Patient ID: Scott Choi, male    DOB: [birth year]  MRN: [id number]  CC: Hypertension   Subjective: Scott Choi is a 59 y.o. male who presents for chronic disease management. Last seen 04/2019. His concerns today include:  Patient with history of EtOH abuse, seizure disorder, HTN, iron deficiency anemia, hiatal hernia, former smoker  Homeless: Patient is still homeless. Stays with a friend off and on. He works at a [employer] and his employer allows him to sleep in the shop when he is unable to stay with his friend. He would like information on how to apply for Medicaid.  Hep C screen positive on blood test done on last visit. He is here today so that we can do the confirmatory test. 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 Reports that he has been clean of street drugs focused sometime. He is also remain alcohol free since our last visit 3 months ago. He also endorses multiple sexual partners in the past. Had only one blood transfusion and that was last year while in the hospital.  Sz:  No sz since last visit. Endorses compliance with Keppra. Free of ETOH   BPH: Urine flow has been much better on Flomax. PSA was normal  Anemia:  Still taking iron supplement daily. Anemia had improved slightly on last blood test. He denies any dizziness or fatigue  HTN:  Taking Norvasc. Denies any headaches, chest pains or shortness of breath. He tries to limit salt in the foods.  HM: He was at a location yesterday where our mobile Zenaida Niece was giving COVID-19 vaccines but he was afraid to get the vaccine because of reports of blood clots with the [employer] vaccine Patient Active Problem List   Diagnosis Date Noted  . Alcohol abuse, in remission 04/24/2019  . Influenza vaccination declined 04/24/2019  . Homeless 04/24/2019  . Gastritis 01/15/2019  . Esophagitis 01/15/2019  . Hiatal hernia 01/15/2019  . Essential hypertension 01/15/2019  . Alcohol dependence with uncomplicated withdrawal (HCC)  01/13/2019  . Iron deficiency anemia due to chronic blood loss 01/13/2019  . Seizure disorder (HCC) 01/13/2019     Current Outpatient Medications on File Prior to Visit  Medication Sig Dispense Refill  . ferrous sulfate 325 (65 FE) MG tablet Take 1 tablet (325 mg total) by mouth daily with breakfast. 100 tablet 1  . levETIRAcetam (KEPPRA) 500 MG tablet Take 1 tablet (500 mg total) by mouth 2 (two) times daily. 60 tablet 5  . Multiple Vitamin (MULTIVITAMIN WITH MINERALS) TABS tablet Take 1 tablet by mouth daily. 30 tablet 1  . pantoprazole (PROTONIX) 40 MG tablet Take 1 tablet (40 mg total) by mouth 2 (two) times daily. 30 tablet 1   No current facility-administered medications on file prior to visit.    Allergies  Allergen Reactions  . Aspirin     shaking    Social History   Socioeconomic History  . Marital status: Married    Spouse name: Not on file  . Number of children: Not on file  . Years of education: Not on file  . Highest education level: Not on file  Occupational History  . Not on file  Tobacco Use  . Smoking status: Former Games developer  . Smokeless tobacco: Never Used  Substance and Sexual Activity  . Alcohol use: Yes    Alcohol/week: 42.0 standard drinks    Types: 28 Cans of beer, 14 Shots of liquor per week  . Drug use: Yes  Types: Marijuana  . Sexual activity: Never  Other Topics Concern  . Not on file  Social History Narrative  . Not on file   Social Determinants of Health   Financial Resource Strain:   . Difficulty of Paying Living Expenses:   Food Insecurity:   . Worried About Programme researcher, broadcasting/film/video in the Last Year:   . Barista in the Last Year:   Transportation Needs:   . Freight forwarder (Medical):   Marland Kitchen Lack of Transportation (Non-Medical):   Physical Activity:   . Days of Exercise per Week:   . Minutes of Exercise per Session:   Stress:   . Feeling of Stress :   Social Connections:   . Frequency of Communication with Friends and  Family:   . Frequency of Social Gatherings with Friends and Family:   . Attends Religious Services:   . Active Member of Clubs or Organizations:   . Attends Banker Meetings:   Marland Kitchen Marital Status:   Intimate Partner Violence:   . Fear of Current or Ex-Partner:   . Emotionally Abused:   Marland Kitchen Physically Abused:   . Sexually Abused:     No family history on file.  Past Surgical History:  Procedure Laterality Date  . BIOPSY  01/14/2019   Procedure: BIOPSY;  Surgeon: Lynann Bologna, MD;  Location: [facility] ENDOSCOPY;  Service: Endoscopy;;  . COLONOSCOPY WITH PROPOFOL N/A 01/14/2019   Procedure: COLONOSCOPY WITH PROPOFOL;  Surgeon: Lynann Bologna, MD;  Location: [facility] ENDOSCOPY;  Service: Endoscopy;  Laterality: N/A;  . ESOPHAGOGASTRODUODENOSCOPY N/A 01/14/2019   Procedure: ESOPHAGOGASTRODUODENOSCOPY (EGD);  Surgeon: Lynann Bologna, MD;  Location: [facility] ENDOSCOPY;  Service: Endoscopy;  Laterality: N/A;    ROS: Review of Systems Negative except as stated above  PHYSICAL EXAM: BP (!) 165/93   Pulse 60   Temp (!) 97 F (36.1 C)   Resp 16   Ht 5' 9.5" (1.765 m)   Wt 156 lb 12.8 oz (71.1 kg)   SpO2 100%   BMI 22.82 kg/m   Physical Exam  General appearance - alert, well appearing, older African-American male and in no distress Mental status - normal mood, behavior, speech, dress, motor activity, and thought processes Neck - supple, no significant adenopathy Chest - clear to auscultation, no wheezes, rales or rhonchi, symmetric air entry Heart - normal rate, regular rhythm, normal S1, S2, no murmurs, rubs, clicks or gallops Abdomen-soft and nontender. No organomegaly Extremities - peripheral pulses normal, no pedal edema, no clubbing or cyanosis  CMP Latest Ref Rng & Units 04/24/2019 01/15/2019 01/14/2019  Glucose 65 - 99 mg/dL 91 409(W) 119(J)  BUN 6 - 24 mg/dL 5(L) <4(N) <8(G)  Creatinine 0.76 - 1.27 mg/dL 9.56 2.13 0.86  Sodium 134 - 144 mmol/L 140 137 136  Potassium 3.5 - 5.2  mmol/L 4.6 3.2(L) 3.1(L)  Chloride 96 - 106 mmol/L 102 106 103  CO2 20 - 29 mmol/L 25 21(L) 22  Calcium 8.7 - 10.2 mg/dL 9.6 5.7(Q) 8.9  Total Protein 6.0 - 8.5 g/dL 7.8 - 7.3  Total Bilirubin 0.0 - 1.2 mg/dL <4.6 - 0.9  Alkaline Phos 39 - 117 IU/L 87 - 91  AST 0 - 40 IU/L 59(H) - 148(H)  ALT 0 - 44 IU/L 54(H) - 43   Lipid Panel     Component Value Date/Time   CHOL 174 04/24/2019 1608   TRIG 90 04/24/2019 1608   HDL 59 04/24/2019 1608   CHOLHDL 2.9 04/24/2019 1608  LDLCALC 98 04/24/2019 1608    CBC    Component Value Date/Time   WBC 5.7 04/24/2019 1608   WBC 7.9 01/15/2019 0610   RBC 3.97 (L) 04/24/2019 1608   RBC 3.30 (L) 01/15/2019 0610   HGB 8.4 (L) 04/24/2019 1608   HCT 29.3 (L) 04/24/2019 1608   PLT 318 04/24/2019 1608   MCV 74 (L) 04/24/2019 1608   MCH 21.2 (L) 04/24/2019 1608   MCH 22.4 (L) 01/15/2019 0610   MCHC 28.7 (L) 04/24/2019 1608   MCHC 29.1 (L) 01/15/2019 0610   RDW 17.2 (H) 04/24/2019 1608   LYMPHSABS 1.0 01/15/2019 0610   MONOABS 1.3 (H) 01/15/2019 0610   EOSABS 0.1 01/15/2019 0610   BASOSABS 0.1 01/15/2019 0610    ASSESSMENT AND PLAN: 1. Essential hypertension Not at goal. Increase amlodipine to 10 mg daily. Follow-up with clinical pharmacist in 2 weeks for blood pressure recheck. If still elevated, we can add HCTZ and have him return to the lab 1 week after starting the medication for chemistry (BMP) to check potassium.  - amLODipine (NORVASC) 10 MG tablet; Take 1 tablet (10 mg total) by mouth daily.  Dispense: 30 tablet; Refill: 6  2. Benign prostatic hyperplasia with weak urinary stream Doing well on Flomax - tamsulosin (FLOMAX) 0.4 MG CAPS capsule; Take 1 capsule (0.4 mg total) by mouth daily.  Dispense: 30 capsule; Refill: 6  3. Iron deficiency anemia, unspecified iron deficiency anemia type Continue iron supplement. - CBC  4. Positive hepatitis C antibody test We discussed the meaning of positive hepatitis C screen. I recommend that  we do the confirmatory test today. Discussed with him ways that people can become infected with hepatitis C virus including history of IVDA, history of multiple sex partners, blood transfusion in the 22s. He is currently not sexually active. Patient advised to avoid sharing things like needles, toothbrushes and shavers. If confirmatory test is positive, we will refer him to ID to be considered for treatment - HCV RNA quant; Future - HCV RNA quant  5. Encounter for screening for HIV Patient agreeable to be screened - HIV Antibody (routine testing w rflx)  6. Educated about COVID-19 virus infection Encourage advised him to get the COVID-19 vaccine. I assured him that the vaccines are relatively safe and effective.  We'll have our caseworker follow-up with him to give information about how to apply for Medicaid.   Patient was given the opportunity to ask questions.  Patient verbalized understanding of the plan and was able to repeat key elements of the plan.   Orders Placed This Encounter  Procedures  . HIV Antibody (routine testing w rflx)  . CBC  . HCV RNA quant     Requested Prescriptions   Signed Prescriptions Disp Refills  . tamsulosin (FLOMAX) 0.4 MG CAPS capsule 30 capsule 6    Sig: Take 1 capsule (0.4 mg total) by mouth daily.  Marland Kitchen amLODipine (NORVASC) 10 MG tablet 30 tablet 6    Sig: Take 1 tablet (10 mg total) by mouth daily.    Return in about 3 months (around 11/15/2019) for Give appt with [facility] in 2 wks for BP check. Karle Plumber, MD, FACP

## 2019-08-16 ENCOUNTER — Other Ambulatory Visit: Payer: Self-pay | Admitting: Internal Medicine

## 2019-08-16 ENCOUNTER — Telehealth (INDEPENDENT_AMBULATORY_CARE_PROVIDER_SITE_OTHER): Payer: Self-pay

## 2019-08-16 NOTE — Telephone Encounter (Signed)
Contacted pt to go over lab results pt is aware and doesn't have any questions or concerns 

## 2019-08-16 NOTE — Progress Notes (Signed)
Let patient know that his HIV test was negative.  The results of the confirmatory test for hepatitis C is still pending.  He is no longer anemic.  He can stop the iron supplement tablets.

## 2019-08-17 ENCOUNTER — Telehealth: Payer: Self-pay

## 2019-08-17 ENCOUNTER — Other Ambulatory Visit: Payer: Self-pay | Admitting: Internal Medicine

## 2019-08-17 DIAGNOSIS — B182 Chronic viral hepatitis C: Secondary | ICD-10-CM | POA: Insufficient documentation

## 2019-08-17 LAB — CBC
Hematocrit: 41.4 % (ref 37.5–51.0)
Hemoglobin: 13.3 g/dL (ref 13.0–17.7)
MCH: 28.2 pg (ref 26.6–33.0)
MCHC: 32.1 g/dL (ref 31.5–35.7)
MCV: 88 fL (ref 79–97)
Platelets: 152 10*3/uL (ref 150–450)
RBC: 4.72 x10E6/uL (ref 4.14–5.80)
RDW: 14.6 % (ref 11.6–15.4)
WBC: 5.6 10*3/uL (ref 3.4–10.8)

## 2019-08-17 LAB — HIV ANTIBODY (ROUTINE TESTING W REFLEX): HIV Screen 4th Generation wRfx: NONREACTIVE

## 2019-08-17 LAB — HCV RNA QUANT
HCV log10: 5.987 log10 IU/mL
Hepatitis C Quantitation: 971000 IU/mL

## 2019-08-17 NOTE — Telephone Encounter (Signed)
-----   Message from Marcine Matar, MD sent at 08/17/2019  8:16 AM EDT ----- Let patient know that his lab test confirms that he does have hepatitis C.  I will submit a referral for him to see the infectious disease specialist to be considered for treatment.

## 2019-08-17 NOTE — Telephone Encounter (Signed)
Contacted pt to go over lab results pt didn't answer left a detailed vm informing pt of results and if he has any questions or concerns to give a call  °

## 2019-08-17 NOTE — Progress Notes (Signed)
Let patient know that his lab test confirms that he does have hepatitis C.  I will submit a referral for him to see the infectious disease specialist to be considered for treatment.

## 2019-08-25 ENCOUNTER — Telehealth: Payer: Self-pay | Admitting: Licensed Clinical Social Worker

## 2019-08-25 NOTE — Telephone Encounter (Signed)
Call placed to patient regarding [facility] referral. LCSW left message requesting a return call.  

## 2019-08-29 ENCOUNTER — Ambulatory Visit: Payer: Self-pay | Attending: Internal Medicine | Admitting: Pharmacist

## 2019-08-29 ENCOUNTER — Other Ambulatory Visit: Payer: Self-pay

## 2019-08-29 ENCOUNTER — Encounter: Payer: Self-pay | Admitting: Pharmacist

## 2019-08-29 VITALS — BP 133/79 | HR 72

## 2019-08-29 DIAGNOSIS — I10 Essential (primary) hypertension: Secondary | ICD-10-CM

## 2019-08-29 NOTE — Progress Notes (Signed)
    S:    PCP: Dr. Laural Benes  Patient arrives in good spirits. Presents to the clinic for BP check.  Patient was referred and last seen by Primary Care Provider on 08/15/2019.    Medication adherence reports.  Current BP Medications include:  Amlodipine 10 mg daily   Dietary habits include: limits salt; denies consuming excess caffeine  Exercise habits include: walks daily in the morning for >30 minutes Family / Social history:  - FHx: no pertinent positives listed  - Tobacco: former smoker  - Alcohol: still uses alcohol but will not give an actual amount consumed   O:  Vitals:   08/29/19 0925  BP: 133/79  Pulse: 72   Home BP readings: none  Last 3 Office BP readings: BP Readings from Last 3 Encounters:  08/29/19 133/79  08/15/19 (!) 165/93  04/24/19 (!) 189/100    BMET    Component Value Date/Time   NA 140 04/24/2019 1608   K 4.6 04/24/2019 1608   CL 102 04/24/2019 1608   CO2 25 04/24/2019 1608   GLUCOSE 91 04/24/2019 1608   GLUCOSE 102 (H) 01/15/2019 0610   BUN 5 (L) 04/24/2019 1608   CREATININE 0.80 04/24/2019 1608   CALCIUM 9.6 04/24/2019 1608   GFRNONAA 100 04/24/2019 1608   GFRAA 115 04/24/2019 1608    Renal function: CrCl cannot be calculated (Patient's most recent lab result is older than the maximum 21 days allowed.).  Clinical ASCVD: No  The 10-year ASCVD risk score Denman George DC Jr., et al., 2013) is: 11.7%   Values used to calculate the score:     Age: 59 years     Sex: Male     Is Non-Hispanic African American: Yes     Diabetic: No     Tobacco smoker: No     Systolic Blood Pressure: 133 mmHg     Is BP treated: Yes     HDL Cholesterol: 59 mg/dL     Total Cholesterol: 174 mg/dL  A/P: Hypertension longstanding currently close to goal on current medications. BP Goal = < 130/80 mmHg. Medication adherence reported. BP today is drastically improved. Will make no additional changes and recheck in 1 month.  -Continued current regimen. -Counseled on  lifestyle modifications for blood pressure control including reduced dietary sodium, increased exercise, adequate sleep.  Results reviewed and written information provided.   Total time in face-to-face counseling 15 minutes.   F/U Clinic Visit in 1 month.   Butch Penny, PharmD, CPP Clinical Pharmacist [facility] [phone]

## 2019-09-07 MED FILL — AMLODIPINE BESYLATE 5 MG TA: 5 | 30 days supply | Qty: 45 | Fill #2

## 2019-09-12 ENCOUNTER — Telehealth: Payer: Self-pay | Admitting: Licensed Clinical Social Worker

## 2019-09-12 NOTE — Telephone Encounter (Signed)
Call placed to patient regarding interest in applying for Medicaid. LCSW informed patient about [employer], in the event pt does not meet Medicaid eligibility. Pt provided consent for LCSW to mail application to address on file.   LCSW strongly encouraged patient to schedule an appointment if he needs any further assistance. No additional concerns noted.

## 2019-09-29 ENCOUNTER — Ambulatory Visit: Payer: Self-pay | Admitting: Pharmacist

## 2019-10-04 ENCOUNTER — Ambulatory Visit: Payer: Self-pay | Admitting: Pharmacist

## 2019-10-10 ENCOUNTER — Ambulatory Visit: Payer: Self-pay | Admitting: Pharmacist

## 2019-10-10 ENCOUNTER — Other Ambulatory Visit: Payer: Self-pay

## 2019-10-10 MED FILL — AMLODIPINE BESYLATE 10 MG T: 10 | 30 days supply | Qty: 30 | Fill #0

## 2019-10-10 MED FILL — FERROUS SULFATE 325 MG TAB: 325 (65 FE) | 30 days supply | Qty: 30 | Fill #3

## 2019-10-10 MED FILL — levETIRAcetam 500 MG TABS: 500 | 30 days supply | Qty: 60 | Fill #2

## 2019-10-10 MED FILL — TAMSULOSIN HCL 0.4 MG CAP: 0.4 | 30 days supply | Qty: 30 | Fill #3

## 2019-10-19 ENCOUNTER — Ambulatory Visit: Payer: Self-pay | Admitting: Pharmacist

## 2019-10-24 ENCOUNTER — Telehealth: Payer: Self-pay | Admitting: Pharmacy Technician

## 2019-10-24 ENCOUNTER — Encounter: Payer: Self-pay | Admitting: Family

## 2019-10-24 NOTE — Telephone Encounter (Signed)
[facility] Patient Advocate Encounter ° °Insurance verification completed.   ° °The patient is uninsured and will need patient assistance for medication. ° °We can complete the application and will need to meet with the patient for signatures and income documentation. ° °Tyrus Wilms E. Lido Maske, CPhT °Specialty Pharmacy Patient Advocate °[facility] °Phone: [phone] °Fax:  [phone] ° ° °

## 2019-11-13 ENCOUNTER — Ambulatory Visit: Payer: Self-pay | Attending: Internal Medicine

## 2019-11-13 DIAGNOSIS — Z23 Encounter for immunization: Secondary | ICD-10-CM

## 2019-11-13 NOTE — Progress Notes (Signed)
   Covid-19 Vaccination Clinic  Name:  Scott Choi    MRN: [id number] DOB: 01/21/1961  11/13/2019  [name] was observed post Covid-19 immunization for 15 minutes without incident. He was provided with Vaccine Information Sheet and instruction to access the V-Safe system.   [name] was instructed to call 911 with any severe reactions post vaccine: Marland Kitchen Difficulty breathing  . Swelling of face and throat  . A fast heartbeat  . A bad rash all over body  . Dizziness and weakness   Immunizations Administered    Name Date Dose VIS Date Route   Moderna COVID-19 Vaccine 11/13/2019  2:57 PM 0.5 mL 03/2019 Intramuscular   Manufacturer: Gala Murdoch   Lot: [id number]   NDC: 29191-660-60

## 2019-11-16 ENCOUNTER — Ambulatory Visit: Payer: Self-pay | Admitting: Internal Medicine

## 2019-11-28 MED FILL — FERROUS SULFATE 325 MG TAB: 325 (65 FE) | 30 days supply | Qty: 30 | Fill #4

## 2019-11-28 MED FILL — TAMSULOSIN HCL 0.4 MG CAP: 0.4 | 30 days supply | Qty: 30 | Fill #0

## 2019-11-28 MED FILL — AMLODIPINE BESYLATE 10 MG T: 10 | 30 days supply | Qty: 30 | Fill #1

## 2020-01-08 MED FILL — levETIRAcetam 500 MG TABS: 500 | 30 days supply | Qty: 60 | Fill #3

## 2020-01-08 MED FILL — TAMSULOSIN HCL 0.4 MG CAP: 0.4 | 30 days supply | Qty: 30 | Fill #1

## 2020-01-08 MED FILL — AMLODIPINE BESYLATE 10 MG T: 10 | 30 days supply | Qty: 30 | Fill #2

## 2020-01-08 MED FILL — FERROUS SULFATE 325 MG TAB: 325 (65 FE) | 30 days supply | Qty: 30 | Fill #5

## 2020-01-23 ENCOUNTER — Other Ambulatory Visit: Payer: Self-pay

## 2020-01-23 ENCOUNTER — Ambulatory Visit: Payer: Self-pay | Attending: Internal Medicine | Admitting: Internal Medicine

## 2020-02-09 MED FILL — AMLODIPINE BESYLATE 10 MG T: 10 | 30 days supply | Qty: 30 | Fill #3

## 2020-02-09 MED FILL — FERROUS SULFATE 325 MG TAB: 325 (65 FE) | 20 days supply | Qty: 20 | Fill #6

## 2020-02-09 MED FILL — TAMSULOSIN HCL 0.4 MG CAP: 0.4 | 30 days supply | Qty: 30 | Fill #2

## 2020-03-14 IMAGING — CT CT HEAD W/O CM
4 series · 16 of 47 positions shown, 18 images · non-contrast
Comparison: Head CT 10/22/2006 [HOSPITAL]

CLINICAL DATA: 58-year-old male with seizure witnessed by
bystander. Altered mental status.

EXAM:
CT HEAD WITHOUT CONTRAST
TECHNIQUE: Contiguous axial images were obtained from the base of the skull
through the vertex without intravenous contrast.

[Series 3: head wo · axial · 0.43mm/px · z∈[-187,-67]mm · 7 of 33 slices shown, 9 images]
[im 5/33  brain]
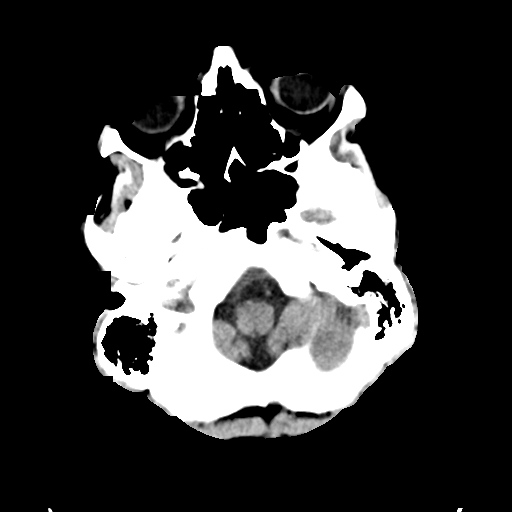
[im 5/33  bone]
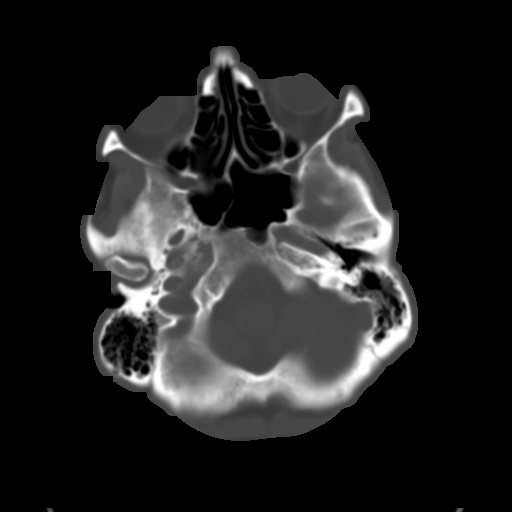
[im 9/33  brain]
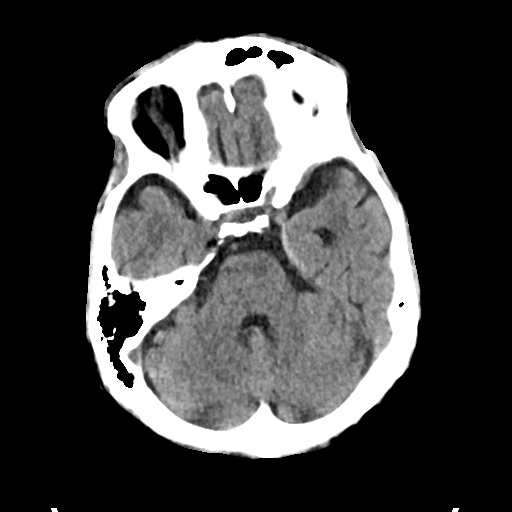
[im 13/33  brain]
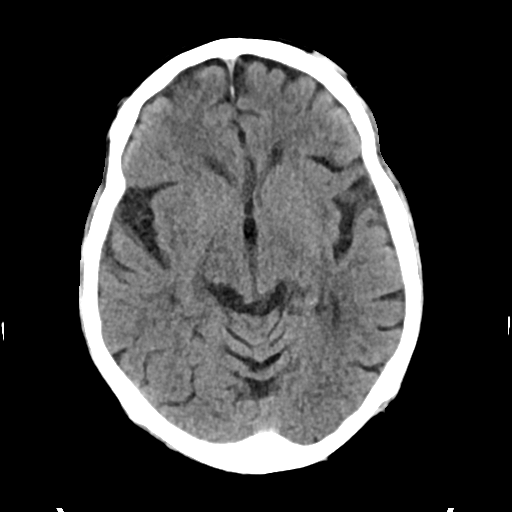
[im 17/33  brain]
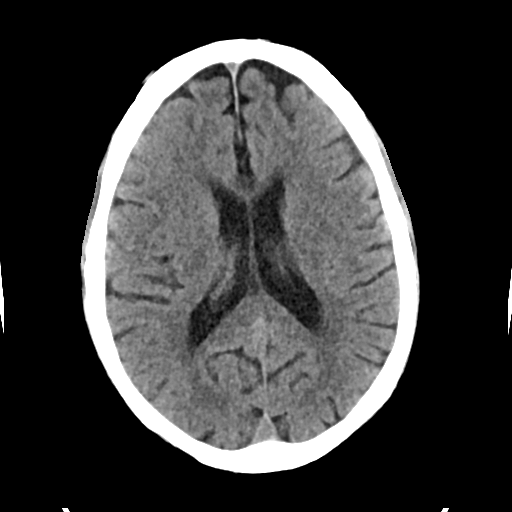
[im 21/33  brain]
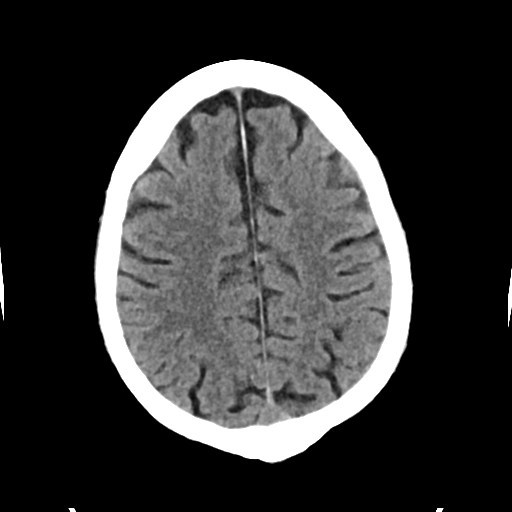
[im 21/33  bone]
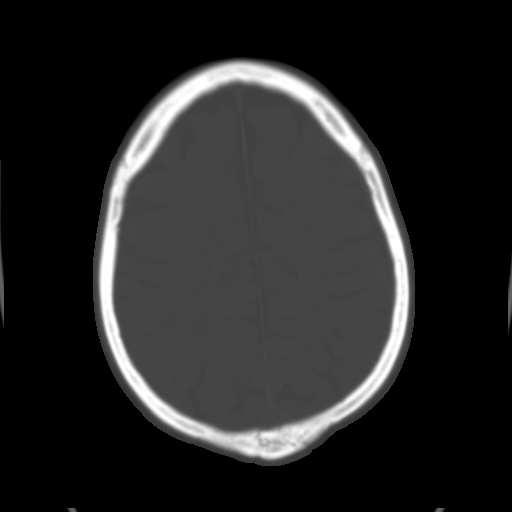
[im 25/33  brain]
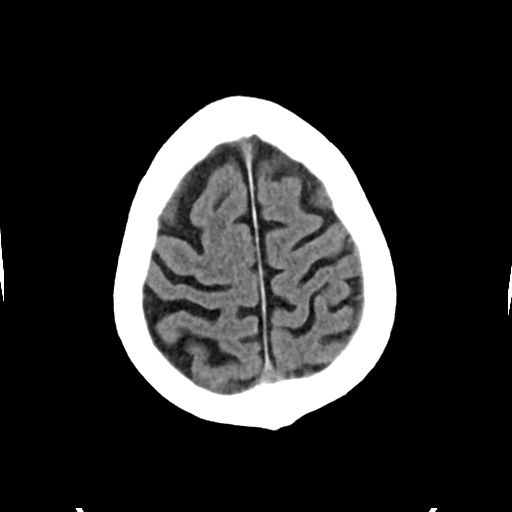
[im 29/33  brain]
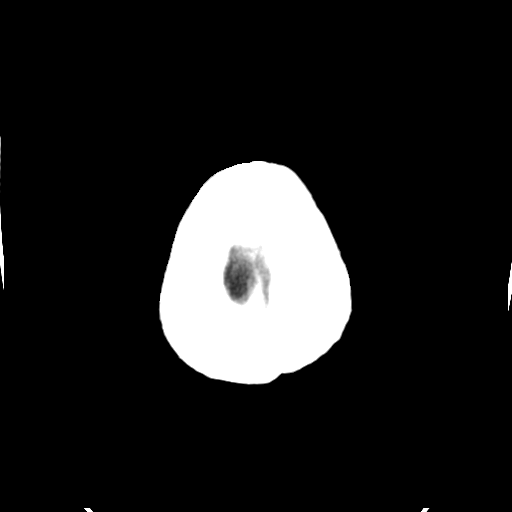

[Series 4: head bone · axial · 0.43mm/px · z∈[-191,-159]mm · 3 of 81 slices shown]
[im 9/81  bone]
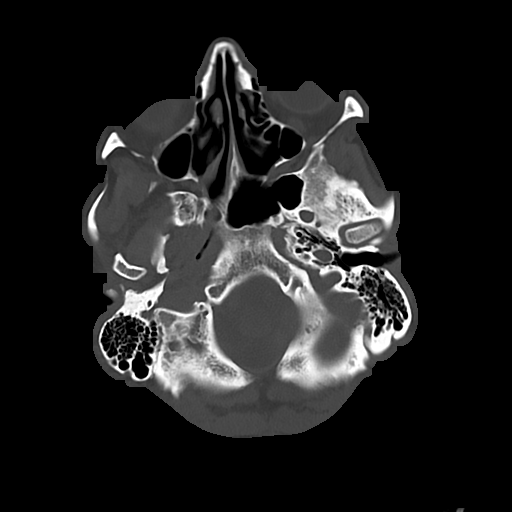
[im 17/81  bone]
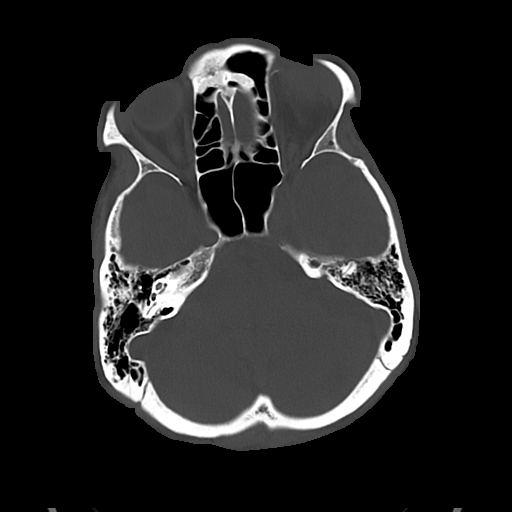
[im 25/81  bone]
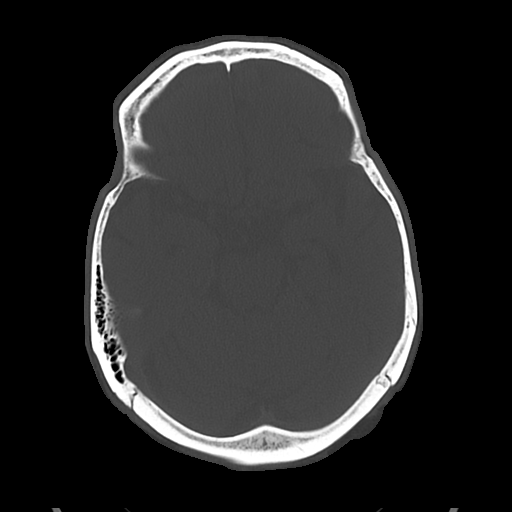

[Series 5: cor soft · coronal · 0.31mm/px · 3 of 70 slices shown]
[im 24/70  brain]
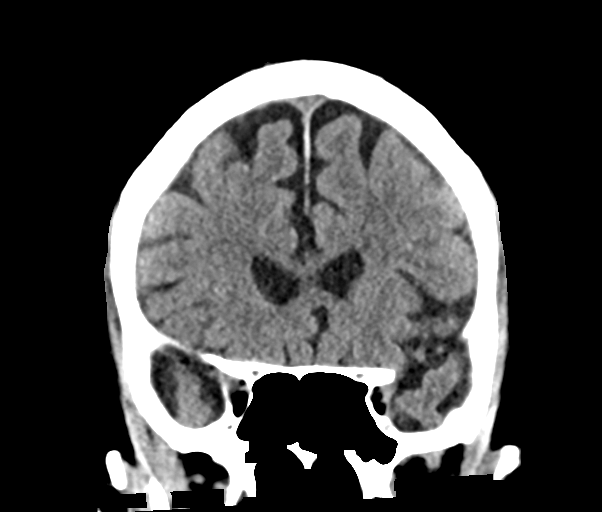
[im 31/70  brain]
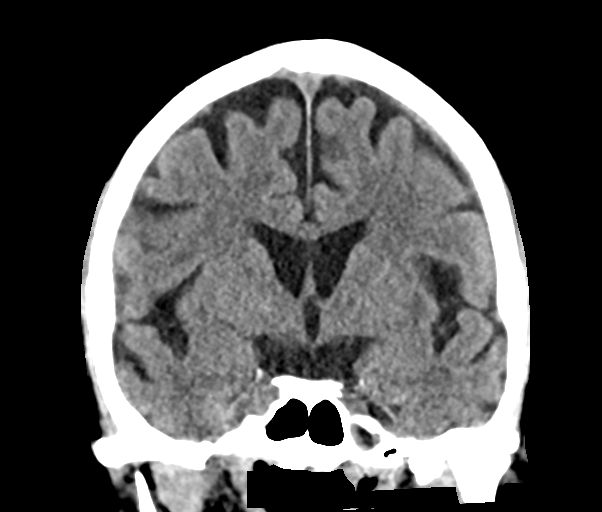
[im 39/70  brain]
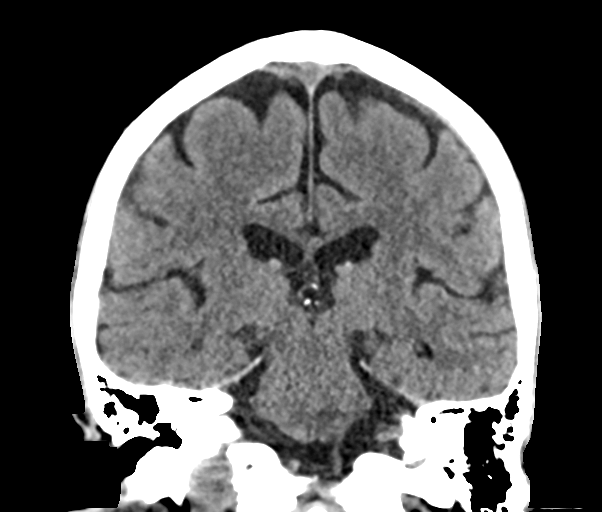

[Series 6: sag soft · sagittal · 0.31mm/px · 3 of 63 slices shown]
[im 21/63  brain]
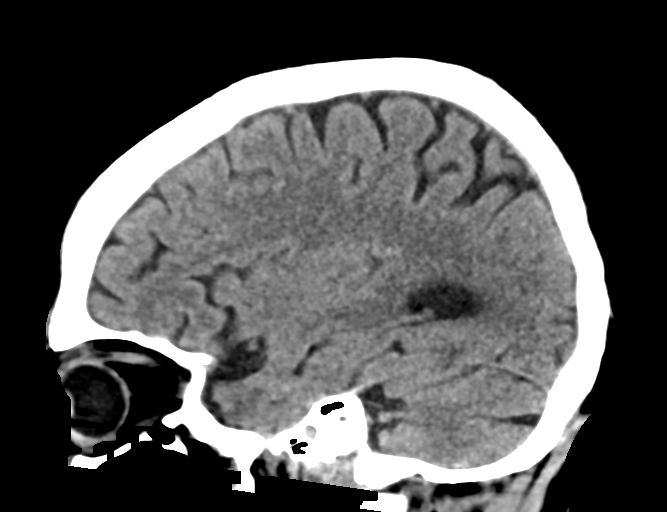
[im 32/63  brain]
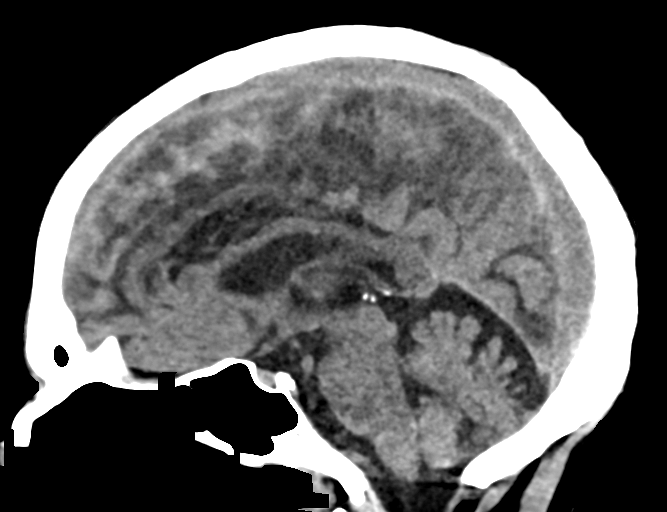
[im 42/63  brain]
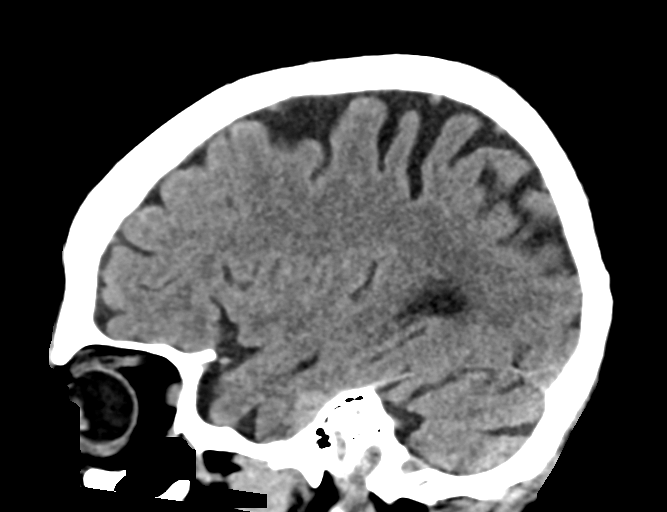

[16 of 47 positions shown; findings below may reference images not displayed]

FINDINGS: Brain: Mild cerebral volume loss since 1002 appears generalized. No
midline shift, ventriculomegaly, mass effect, evidence of mass
lesion, intracranial hemorrhage or evidence of cortically based
acute infarction. Gray-white matter differentiation is within normal
limits throughout the brain.

Vascular: Minimal Calcified atherosclerosis at the skull base. No
suspicious intracranial vascular hyperdensity.

Skull: No acute osseous abnormality identified.

Sinuses/Orbits: Mild right maxillary sinus mucosal thickening,
otherwise clear.

Other: Chronic left posterior scalp lipoma. No acute orbit or scalp
soft tissue findings.
IMPRESSION: No acute intracranial abnormality. Negative for age non contrast CT
appearance of the brain.

## 2022-06-04 ENCOUNTER — Telehealth: Payer: Self-pay | Admitting: Internal Medicine

## 2022-06-04 ENCOUNTER — Other Ambulatory Visit: Payer: Self-pay

## 2022-06-04 NOTE — Telephone Encounter (Signed)
Patient called on 02/08. Patient says he is supposed to be treated for his liver and was supposed to have a referral. Patient says he was never contacted about his liver. Please follow up with patient.

## 2022-06-05 NOTE — Telephone Encounter (Signed)
Patient was called and left a VM informing him that he will need to wait until his upcoming appointment to discuss concerns. Patient has not been seen in 2 years.

## 2022-06-29 ENCOUNTER — Ambulatory Visit: Payer: Self-pay | Admitting: Internal Medicine

## 2022-08-25 ENCOUNTER — Ambulatory Visit: Payer: Self-pay | Admitting: Internal Medicine

## 2022-10-26 ENCOUNTER — Ambulatory Visit: Payer: Medicaid Other | Admitting: Internal Medicine

## 2022-11-27 ENCOUNTER — Telehealth: Payer: Self-pay | Admitting: Internal Medicine

## 2022-11-27 NOTE — Telephone Encounter (Signed)
Copied from CRM [id number]. Topic: General - Other >> Nov 25, 2022 11:50 AM Franchot Heidelberg wrote: Reason for CRM: Pt is requesting a call back from the social worker, please advise   Best contact:  [phone]

## 2022-12-10 ENCOUNTER — Telehealth: Payer: Self-pay | Admitting: Licensed Clinical Social Worker

## 2022-12-10 NOTE — Telephone Encounter (Signed)
Spoke with pt. He was ask about his food stamp status and when his next appointment was with Laural Benes. I was able to provide heim with these things. Not other concerns were mentioned.

## 2023-02-02 ENCOUNTER — Ambulatory Visit: Payer: Medicaid Other | Admitting: Internal Medicine

## 2023-02-18 ENCOUNTER — Ambulatory Visit: Payer: Medicaid Other | Admitting: Physician Assistant

## 2023-03-11 ENCOUNTER — Ambulatory Visit: Payer: Medicaid Other | Admitting: Physician Assistant

## 2023-04-01 ENCOUNTER — Ambulatory Visit: Payer: Medicaid Other | Admitting: Internal Medicine
